# Patient Record
Sex: Female | Born: 1935 | Race: White | Hispanic: No | Marital: Married | Smoking: Former smoker
Health system: Southern US, Community
[De-identification: ages and names within clinical notes are randomized; demographics above are authoritative.]

## PROBLEM LIST (undated history)

## (undated) DIAGNOSIS — J219 Acute bronchiolitis, unspecified: Secondary | ICD-10-CM

## (undated) DIAGNOSIS — F419 Anxiety disorder, unspecified: Secondary | ICD-10-CM

## (undated) DIAGNOSIS — C801 Malignant (primary) neoplasm, unspecified: Secondary | ICD-10-CM

## (undated) DIAGNOSIS — R51 Headache: Secondary | ICD-10-CM

## (undated) DIAGNOSIS — Z1501 Genetic susceptibility to malignant neoplasm of breast: Secondary | ICD-10-CM

## (undated) DIAGNOSIS — J309 Allergic rhinitis, unspecified: Secondary | ICD-10-CM

## (undated) DIAGNOSIS — R0602 Shortness of breath: Secondary | ICD-10-CM

## (undated) DIAGNOSIS — Z1509 Genetic susceptibility to other malignant neoplasm: Secondary | ICD-10-CM

## (undated) DIAGNOSIS — F32A Depression, unspecified: Secondary | ICD-10-CM

## (undated) DIAGNOSIS — J45909 Unspecified asthma, uncomplicated: Secondary | ICD-10-CM

## (undated) DIAGNOSIS — Z803 Family history of malignant neoplasm of breast: Secondary | ICD-10-CM

## (undated) DIAGNOSIS — M1990 Unspecified osteoarthritis, unspecified site: Secondary | ICD-10-CM

## (undated) DIAGNOSIS — Z9889 Other specified postprocedural states: Secondary | ICD-10-CM

## (undated) DIAGNOSIS — Z5189 Encounter for other specified aftercare: Secondary | ICD-10-CM

## (undated) DIAGNOSIS — R011 Cardiac murmur, unspecified: Secondary | ICD-10-CM

## (undated) DIAGNOSIS — C50919 Malignant neoplasm of unspecified site of unspecified female breast: Secondary | ICD-10-CM

## (undated) DIAGNOSIS — R112 Nausea with vomiting, unspecified: Secondary | ICD-10-CM

## (undated) DIAGNOSIS — J189 Pneumonia, unspecified organism: Secondary | ICD-10-CM

## (undated) DIAGNOSIS — H532 Diplopia: Secondary | ICD-10-CM

## (undated) DIAGNOSIS — J449 Chronic obstructive pulmonary disease, unspecified: Secondary | ICD-10-CM

## (undated) DIAGNOSIS — J4489 Other specified chronic obstructive pulmonary disease: Secondary | ICD-10-CM

## (undated) DIAGNOSIS — M25473 Effusion, unspecified ankle: Secondary | ICD-10-CM

## (undated) DIAGNOSIS — I10 Essential (primary) hypertension: Secondary | ICD-10-CM

## (undated) DIAGNOSIS — F329 Major depressive disorder, single episode, unspecified: Secondary | ICD-10-CM

## (undated) HISTORY — DX: Genetic susceptibility to malignant neoplasm of breast: Z15.01

## (undated) HISTORY — PX: COLONOSCOPY: SHX174

## (undated) HISTORY — PX: TONSILLECTOMY: SUR1361

## (undated) HISTORY — DX: Genetic susceptibility to malignant neoplasm of breast: Z15.09

## (undated) HISTORY — DX: Malignant (primary) neoplasm, unspecified: C80.1

## (undated) HISTORY — DX: Allergic rhinitis, unspecified: J30.9

## (undated) HISTORY — PX: CATARACT EXTRACTION: SUR2

## (undated) HISTORY — DX: Cardiac murmur, unspecified: R01.1

## (undated) HISTORY — DX: Chronic obstructive pulmonary disease, unspecified: J44.9

## (undated) HISTORY — DX: Other specified chronic obstructive pulmonary disease: J44.89

## (undated) HISTORY — DX: Malignant neoplasm of unspecified site of unspecified female breast: C50.919

## (undated) HISTORY — DX: Essential (primary) hypertension: I10

## (undated) HISTORY — PX: SKIN CANCER DESTRUCTION: SHX778

## (undated) HISTORY — DX: Family history of malignant neoplasm of breast: Z80.3

## (undated) HISTORY — DX: Encounter for other specified aftercare: Z51.89

## (undated) HISTORY — DX: Unspecified osteoarthritis, unspecified site: M19.90

## (undated) HISTORY — PX: TONSILLECTOMY: SHX5217

---

## 1987-06-12 HISTORY — PX: MASTECTOMY: SHX3

## 1997-11-29 ENCOUNTER — Ambulatory Visit (HOSPITAL_COMMUNITY): Admission: RE | Admit: 1997-11-29 | Discharge: 1997-11-29 | Payer: Self-pay | Admitting: Family Medicine

## 1999-01-25 ENCOUNTER — Encounter: Payer: Self-pay | Admitting: Family Medicine

## 1999-01-25 ENCOUNTER — Ambulatory Visit (HOSPITAL_COMMUNITY): Admission: RE | Admit: 1999-01-25 | Discharge: 1999-01-25 | Payer: Self-pay | Admitting: Family Medicine

## 1999-03-13 ENCOUNTER — Other Ambulatory Visit: Admission: RE | Admit: 1999-03-13 | Discharge: 1999-03-13 | Payer: Self-pay | Admitting: Family Medicine

## 2000-01-02 ENCOUNTER — Encounter: Admission: RE | Admit: 2000-01-02 | Discharge: 2000-01-02 | Payer: Self-pay | Admitting: Family Medicine

## 2000-01-02 ENCOUNTER — Encounter: Payer: Self-pay | Admitting: Family Medicine

## 2000-01-30 ENCOUNTER — Ambulatory Visit (HOSPITAL_COMMUNITY): Admission: RE | Admit: 2000-01-30 | Discharge: 2000-01-30 | Payer: Self-pay | Admitting: Family Medicine

## 2000-01-30 ENCOUNTER — Encounter: Payer: Self-pay | Admitting: Family Medicine

## 2000-06-15 ENCOUNTER — Emergency Department (HOSPITAL_COMMUNITY): Admission: EM | Admit: 2000-06-15 | Discharge: 2000-06-15 | Payer: Self-pay | Admitting: Emergency Medicine

## 2001-01-30 ENCOUNTER — Encounter: Payer: Self-pay | Admitting: Family Medicine

## 2001-01-30 ENCOUNTER — Ambulatory Visit (HOSPITAL_COMMUNITY): Admission: RE | Admit: 2001-01-30 | Discharge: 2001-01-30 | Payer: Self-pay | Admitting: Family Medicine

## 2001-04-30 ENCOUNTER — Other Ambulatory Visit: Admission: RE | Admit: 2001-04-30 | Discharge: 2001-04-30 | Payer: Self-pay | Admitting: Family Medicine

## 2001-08-10 ENCOUNTER — Emergency Department (HOSPITAL_COMMUNITY): Admission: EM | Admit: 2001-08-10 | Discharge: 2001-08-10 | Payer: Self-pay | Admitting: Emergency Medicine

## 2002-04-29 ENCOUNTER — Emergency Department (HOSPITAL_COMMUNITY): Admission: EM | Admit: 2002-04-29 | Discharge: 2002-04-29 | Payer: Self-pay | Admitting: Emergency Medicine

## 2002-04-29 ENCOUNTER — Encounter: Payer: Self-pay | Admitting: Emergency Medicine

## 2003-08-10 ENCOUNTER — Emergency Department (HOSPITAL_COMMUNITY): Admission: AD | Admit: 2003-08-10 | Discharge: 2003-08-10 | Payer: Self-pay | Admitting: Emergency Medicine

## 2003-11-30 ENCOUNTER — Ambulatory Visit (HOSPITAL_COMMUNITY): Admission: RE | Admit: 2003-11-30 | Discharge: 2003-11-30 | Payer: Self-pay | Admitting: Family Medicine

## 2004-06-15 ENCOUNTER — Ambulatory Visit: Payer: Self-pay | Admitting: Internal Medicine

## 2004-06-29 ENCOUNTER — Ambulatory Visit: Payer: Self-pay | Admitting: Internal Medicine

## 2004-06-30 ENCOUNTER — Ambulatory Visit: Payer: Self-pay | Admitting: Internal Medicine

## 2004-07-13 ENCOUNTER — Ambulatory Visit: Payer: Self-pay | Admitting: Internal Medicine

## 2004-07-21 ENCOUNTER — Ambulatory Visit: Payer: Self-pay | Admitting: Internal Medicine

## 2004-08-03 ENCOUNTER — Ambulatory Visit: Payer: Self-pay | Admitting: Internal Medicine

## 2004-08-11 ENCOUNTER — Ambulatory Visit: Payer: Self-pay | Admitting: Internal Medicine

## 2004-08-25 ENCOUNTER — Ambulatory Visit: Payer: Self-pay | Admitting: Internal Medicine

## 2004-09-07 ENCOUNTER — Ambulatory Visit: Payer: Self-pay | Admitting: Internal Medicine

## 2004-09-15 ENCOUNTER — Ambulatory Visit: Payer: Self-pay | Admitting: Internal Medicine

## 2004-09-21 ENCOUNTER — Ambulatory Visit: Payer: Self-pay | Admitting: Internal Medicine

## 2004-10-06 ENCOUNTER — Ambulatory Visit: Payer: Self-pay | Admitting: Internal Medicine

## 2004-10-13 ENCOUNTER — Ambulatory Visit: Payer: Self-pay | Admitting: Internal Medicine

## 2004-10-16 ENCOUNTER — Emergency Department (HOSPITAL_COMMUNITY): Admission: EM | Admit: 2004-10-16 | Discharge: 2004-10-16 | Payer: Self-pay | Admitting: Emergency Medicine

## 2004-10-20 ENCOUNTER — Ambulatory Visit: Payer: Self-pay | Admitting: Internal Medicine

## 2004-10-27 ENCOUNTER — Ambulatory Visit: Payer: Self-pay | Admitting: Internal Medicine

## 2004-11-21 ENCOUNTER — Ambulatory Visit: Payer: Self-pay | Admitting: Internal Medicine

## 2004-11-27 ENCOUNTER — Ambulatory Visit: Payer: Self-pay | Admitting: Internal Medicine

## 2004-12-04 ENCOUNTER — Ambulatory Visit (HOSPITAL_COMMUNITY): Admission: RE | Admit: 2004-12-04 | Discharge: 2004-12-04 | Payer: Self-pay | Admitting: Family Medicine

## 2004-12-07 ENCOUNTER — Ambulatory Visit: Payer: Self-pay | Admitting: Internal Medicine

## 2004-12-15 ENCOUNTER — Ambulatory Visit: Payer: Self-pay | Admitting: Internal Medicine

## 2004-12-29 ENCOUNTER — Ambulatory Visit: Payer: Self-pay | Admitting: Internal Medicine

## 2005-01-12 ENCOUNTER — Ambulatory Visit: Payer: Self-pay | Admitting: Internal Medicine

## 2005-02-02 ENCOUNTER — Ambulatory Visit: Payer: Self-pay | Admitting: Internal Medicine

## 2005-02-16 ENCOUNTER — Ambulatory Visit: Payer: Self-pay | Admitting: Internal Medicine

## 2005-02-19 ENCOUNTER — Ambulatory Visit: Payer: Self-pay | Admitting: Internal Medicine

## 2005-02-23 ENCOUNTER — Ambulatory Visit: Payer: Self-pay | Admitting: Internal Medicine

## 2005-03-09 ENCOUNTER — Ambulatory Visit: Payer: Self-pay | Admitting: Internal Medicine

## 2005-03-30 ENCOUNTER — Ambulatory Visit: Payer: Self-pay | Admitting: Internal Medicine

## 2005-04-04 ENCOUNTER — Ambulatory Visit: Payer: Self-pay | Admitting: Pulmonary Disease

## 2005-04-13 ENCOUNTER — Ambulatory Visit: Payer: Self-pay | Admitting: Internal Medicine

## 2005-04-20 ENCOUNTER — Ambulatory Visit: Payer: Self-pay | Admitting: Internal Medicine

## 2005-05-07 ENCOUNTER — Ambulatory Visit: Payer: Self-pay | Admitting: Internal Medicine

## 2005-06-13 ENCOUNTER — Ambulatory Visit: Payer: Self-pay | Admitting: Internal Medicine

## 2005-06-22 ENCOUNTER — Ambulatory Visit: Payer: Self-pay | Admitting: Internal Medicine

## 2005-07-06 ENCOUNTER — Ambulatory Visit: Payer: Self-pay | Admitting: Internal Medicine

## 2005-07-20 ENCOUNTER — Ambulatory Visit: Payer: Self-pay | Admitting: Internal Medicine

## 2005-08-03 ENCOUNTER — Ambulatory Visit: Payer: Self-pay | Admitting: Internal Medicine

## 2005-08-17 ENCOUNTER — Ambulatory Visit: Payer: Self-pay | Admitting: Internal Medicine

## 2005-09-06 ENCOUNTER — Ambulatory Visit: Payer: Self-pay | Admitting: Internal Medicine

## 2005-09-27 ENCOUNTER — Ambulatory Visit: Payer: Self-pay | Admitting: Internal Medicine

## 2005-10-12 ENCOUNTER — Ambulatory Visit: Payer: Self-pay | Admitting: Internal Medicine

## 2005-10-18 ENCOUNTER — Ambulatory Visit: Payer: Self-pay | Admitting: Internal Medicine

## 2005-10-26 ENCOUNTER — Ambulatory Visit: Payer: Self-pay | Admitting: Internal Medicine

## 2005-11-09 ENCOUNTER — Ambulatory Visit: Payer: Self-pay | Admitting: Internal Medicine

## 2005-11-12 ENCOUNTER — Ambulatory Visit: Payer: Self-pay | Admitting: Internal Medicine

## 2005-11-30 ENCOUNTER — Ambulatory Visit: Payer: Self-pay | Admitting: Internal Medicine

## 2005-12-14 ENCOUNTER — Ambulatory Visit: Payer: Self-pay | Admitting: Internal Medicine

## 2006-01-02 ENCOUNTER — Ambulatory Visit: Payer: Self-pay | Admitting: Internal Medicine

## 2006-01-17 ENCOUNTER — Ambulatory Visit: Payer: Self-pay | Admitting: Internal Medicine

## 2006-02-01 ENCOUNTER — Ambulatory Visit: Payer: Self-pay | Admitting: Internal Medicine

## 2006-02-06 ENCOUNTER — Ambulatory Visit (HOSPITAL_COMMUNITY): Admission: RE | Admit: 2006-02-06 | Discharge: 2006-02-06 | Payer: Self-pay | Admitting: Family Medicine

## 2006-02-15 ENCOUNTER — Ambulatory Visit: Payer: Self-pay | Admitting: Internal Medicine

## 2006-03-01 ENCOUNTER — Ambulatory Visit: Payer: Self-pay | Admitting: Internal Medicine

## 2006-03-04 ENCOUNTER — Ambulatory Visit: Payer: Self-pay | Admitting: Internal Medicine

## 2006-03-15 ENCOUNTER — Ambulatory Visit: Payer: Self-pay | Admitting: Internal Medicine

## 2006-03-25 ENCOUNTER — Ambulatory Visit: Payer: Self-pay | Admitting: Internal Medicine

## 2006-04-02 ENCOUNTER — Ambulatory Visit: Payer: Self-pay | Admitting: Pulmonary Disease

## 2006-04-12 ENCOUNTER — Ambulatory Visit: Payer: Self-pay | Admitting: Internal Medicine

## 2006-04-30 ENCOUNTER — Ambulatory Visit: Payer: Self-pay | Admitting: Internal Medicine

## 2006-05-09 ENCOUNTER — Ambulatory Visit: Payer: Self-pay | Admitting: Internal Medicine

## 2006-05-24 ENCOUNTER — Ambulatory Visit: Payer: Self-pay | Admitting: Internal Medicine

## 2006-05-30 ENCOUNTER — Ambulatory Visit: Payer: Self-pay | Admitting: Internal Medicine

## 2006-06-14 ENCOUNTER — Ambulatory Visit: Payer: Self-pay | Admitting: Internal Medicine

## 2006-06-28 ENCOUNTER — Ambulatory Visit: Payer: Self-pay | Admitting: Internal Medicine

## 2006-07-04 ENCOUNTER — Ambulatory Visit: Payer: Self-pay | Admitting: Internal Medicine

## 2006-07-19 ENCOUNTER — Ambulatory Visit: Payer: Self-pay | Admitting: Internal Medicine

## 2006-07-22 ENCOUNTER — Ambulatory Visit: Payer: Self-pay | Admitting: Internal Medicine

## 2006-08-09 ENCOUNTER — Ambulatory Visit: Payer: Self-pay | Admitting: Internal Medicine

## 2006-08-23 ENCOUNTER — Ambulatory Visit: Payer: Self-pay | Admitting: Internal Medicine

## 2006-09-02 ENCOUNTER — Ambulatory Visit: Payer: Self-pay | Admitting: Internal Medicine

## 2006-09-27 ENCOUNTER — Ambulatory Visit: Payer: Self-pay | Admitting: Internal Medicine

## 2006-10-11 ENCOUNTER — Ambulatory Visit: Payer: Self-pay | Admitting: Internal Medicine

## 2006-11-01 ENCOUNTER — Ambulatory Visit: Payer: Self-pay | Admitting: Internal Medicine

## 2006-11-21 ENCOUNTER — Ambulatory Visit: Payer: Self-pay | Admitting: Internal Medicine

## 2007-01-16 ENCOUNTER — Ambulatory Visit: Payer: Self-pay | Admitting: Internal Medicine

## 2007-01-24 ENCOUNTER — Ambulatory Visit: Payer: Self-pay | Admitting: Internal Medicine

## 2007-02-07 ENCOUNTER — Ambulatory Visit: Payer: Self-pay | Admitting: Internal Medicine

## 2007-02-17 ENCOUNTER — Ambulatory Visit (HOSPITAL_COMMUNITY): Admission: RE | Admit: 2007-02-17 | Discharge: 2007-02-17 | Payer: Self-pay | Admitting: Family Medicine

## 2007-02-24 ENCOUNTER — Ambulatory Visit: Payer: Self-pay | Admitting: Internal Medicine

## 2007-03-03 ENCOUNTER — Other Ambulatory Visit: Admission: RE | Admit: 2007-03-03 | Discharge: 2007-03-03 | Payer: Self-pay | Admitting: Family Medicine

## 2007-03-10 ENCOUNTER — Ambulatory Visit: Payer: Self-pay | Admitting: Internal Medicine

## 2007-03-24 ENCOUNTER — Ambulatory Visit: Payer: Self-pay | Admitting: Pulmonary Disease

## 2007-04-09 ENCOUNTER — Encounter: Admission: RE | Admit: 2007-04-09 | Discharge: 2007-04-09 | Payer: Self-pay | Admitting: Family Medicine

## 2007-05-07 ENCOUNTER — Ambulatory Visit: Payer: Self-pay | Admitting: Internal Medicine

## 2007-05-21 ENCOUNTER — Ambulatory Visit: Payer: Self-pay | Admitting: Internal Medicine

## 2007-05-29 ENCOUNTER — Ambulatory Visit: Payer: Self-pay | Admitting: Internal Medicine

## 2007-06-10 ENCOUNTER — Ambulatory Visit: Payer: Self-pay | Admitting: Internal Medicine

## 2007-06-24 ENCOUNTER — Ambulatory Visit: Payer: Self-pay | Admitting: Internal Medicine

## 2007-07-04 ENCOUNTER — Ambulatory Visit: Payer: Self-pay | Admitting: Internal Medicine

## 2007-07-18 ENCOUNTER — Ambulatory Visit: Payer: Self-pay | Admitting: Internal Medicine

## 2007-07-25 ENCOUNTER — Ambulatory Visit: Payer: Self-pay | Admitting: Internal Medicine

## 2007-08-05 ENCOUNTER — Ambulatory Visit: Payer: Self-pay | Admitting: Internal Medicine

## 2007-08-13 DIAGNOSIS — M810 Age-related osteoporosis without current pathological fracture: Secondary | ICD-10-CM

## 2007-08-13 DIAGNOSIS — J310 Chronic rhinitis: Secondary | ICD-10-CM | POA: Insufficient documentation

## 2007-08-13 DIAGNOSIS — I10 Essential (primary) hypertension: Secondary | ICD-10-CM | POA: Insufficient documentation

## 2007-08-13 DIAGNOSIS — J45998 Other asthma: Secondary | ICD-10-CM | POA: Insufficient documentation

## 2007-08-13 DIAGNOSIS — J4489 Other specified chronic obstructive pulmonary disease: Secondary | ICD-10-CM | POA: Insufficient documentation

## 2007-08-13 DIAGNOSIS — J449 Chronic obstructive pulmonary disease, unspecified: Secondary | ICD-10-CM

## 2007-08-25 ENCOUNTER — Ambulatory Visit: Payer: Self-pay | Admitting: Internal Medicine

## 2007-09-09 ENCOUNTER — Ambulatory Visit: Payer: Self-pay | Admitting: Internal Medicine

## 2007-10-03 ENCOUNTER — Ambulatory Visit: Payer: Self-pay | Admitting: Internal Medicine

## 2007-10-17 ENCOUNTER — Ambulatory Visit: Payer: Self-pay | Admitting: Internal Medicine

## 2007-11-07 ENCOUNTER — Ambulatory Visit: Payer: Self-pay | Admitting: Internal Medicine

## 2007-11-21 ENCOUNTER — Ambulatory Visit: Payer: Self-pay | Admitting: Internal Medicine

## 2007-12-01 ENCOUNTER — Telehealth (INDEPENDENT_AMBULATORY_CARE_PROVIDER_SITE_OTHER): Payer: Self-pay | Admitting: *Deleted

## 2007-12-11 ENCOUNTER — Ambulatory Visit: Payer: Self-pay | Admitting: Internal Medicine

## 2007-12-25 ENCOUNTER — Ambulatory Visit: Payer: Self-pay | Admitting: Internal Medicine

## 2008-01-16 ENCOUNTER — Ambulatory Visit: Payer: Self-pay | Admitting: Internal Medicine

## 2008-02-02 ENCOUNTER — Ambulatory Visit: Payer: Self-pay | Admitting: Internal Medicine

## 2008-02-13 ENCOUNTER — Ambulatory Visit: Payer: Self-pay | Admitting: Internal Medicine

## 2008-02-24 ENCOUNTER — Ambulatory Visit (HOSPITAL_COMMUNITY): Admission: RE | Admit: 2008-02-24 | Discharge: 2008-02-24 | Payer: Self-pay | Admitting: Family Medicine

## 2008-02-27 ENCOUNTER — Ambulatory Visit: Payer: Self-pay | Admitting: Pulmonary Disease

## 2008-02-27 ENCOUNTER — Encounter: Payer: Self-pay | Admitting: Internal Medicine

## 2008-03-12 ENCOUNTER — Ambulatory Visit: Payer: Self-pay | Admitting: Internal Medicine

## 2008-03-26 ENCOUNTER — Ambulatory Visit: Payer: Self-pay | Admitting: Internal Medicine

## 2008-04-02 ENCOUNTER — Ambulatory Visit: Payer: Self-pay | Admitting: Internal Medicine

## 2008-04-13 ENCOUNTER — Encounter: Admission: RE | Admit: 2008-04-13 | Discharge: 2008-04-13 | Payer: Self-pay | Admitting: Family Medicine

## 2008-05-04 ENCOUNTER — Ambulatory Visit: Payer: Self-pay | Admitting: Internal Medicine

## 2008-06-10 ENCOUNTER — Ambulatory Visit: Payer: Self-pay | Admitting: Internal Medicine

## 2008-06-10 ENCOUNTER — Ambulatory Visit: Payer: Self-pay | Admitting: Pulmonary Disease

## 2008-06-25 ENCOUNTER — Ambulatory Visit: Payer: Self-pay | Admitting: Internal Medicine

## 2008-07-30 ENCOUNTER — Ambulatory Visit: Payer: Self-pay | Admitting: Internal Medicine

## 2008-08-12 ENCOUNTER — Ambulatory Visit: Payer: Self-pay | Admitting: Internal Medicine

## 2008-09-17 ENCOUNTER — Ambulatory Visit: Payer: Self-pay | Admitting: Internal Medicine

## 2008-09-24 ENCOUNTER — Ambulatory Visit: Payer: Self-pay | Admitting: Internal Medicine

## 2008-10-01 ENCOUNTER — Ambulatory Visit: Payer: Self-pay | Admitting: Internal Medicine

## 2008-10-08 ENCOUNTER — Ambulatory Visit: Payer: Self-pay | Admitting: Internal Medicine

## 2008-10-15 ENCOUNTER — Ambulatory Visit: Payer: Self-pay | Admitting: Internal Medicine

## 2008-10-28 ENCOUNTER — Ambulatory Visit: Payer: Self-pay | Admitting: Internal Medicine

## 2008-11-18 ENCOUNTER — Ambulatory Visit: Payer: Self-pay | Admitting: Internal Medicine

## 2008-12-10 ENCOUNTER — Ambulatory Visit: Payer: Self-pay | Admitting: Internal Medicine

## 2008-12-17 ENCOUNTER — Ambulatory Visit: Payer: Self-pay | Admitting: Internal Medicine

## 2008-12-31 ENCOUNTER — Ambulatory Visit: Payer: Self-pay | Admitting: Internal Medicine

## 2009-01-14 ENCOUNTER — Ambulatory Visit: Payer: Self-pay | Admitting: Internal Medicine

## 2009-01-28 ENCOUNTER — Ambulatory Visit: Payer: Self-pay | Admitting: Internal Medicine

## 2009-02-04 ENCOUNTER — Ambulatory Visit: Payer: Self-pay | Admitting: Internal Medicine

## 2009-02-11 ENCOUNTER — Ambulatory Visit: Payer: Self-pay | Admitting: Internal Medicine

## 2009-02-25 ENCOUNTER — Ambulatory Visit: Payer: Self-pay | Admitting: Internal Medicine

## 2009-03-04 ENCOUNTER — Ambulatory Visit: Payer: Self-pay | Admitting: Internal Medicine

## 2009-03-11 ENCOUNTER — Ambulatory Visit: Payer: Self-pay | Admitting: Internal Medicine

## 2009-03-24 ENCOUNTER — Ambulatory Visit: Payer: Self-pay | Admitting: Internal Medicine

## 2009-04-01 ENCOUNTER — Ambulatory Visit: Payer: Self-pay | Admitting: Internal Medicine

## 2009-04-19 ENCOUNTER — Ambulatory Visit: Payer: Self-pay | Admitting: Internal Medicine

## 2009-04-29 ENCOUNTER — Ambulatory Visit: Payer: Self-pay | Admitting: Internal Medicine

## 2009-05-13 ENCOUNTER — Ambulatory Visit: Payer: Self-pay | Admitting: Internal Medicine

## 2009-05-20 ENCOUNTER — Ambulatory Visit: Payer: Self-pay | Admitting: Internal Medicine

## 2009-06-17 ENCOUNTER — Ambulatory Visit: Payer: Self-pay | Admitting: Internal Medicine

## 2009-07-01 ENCOUNTER — Ambulatory Visit: Payer: Self-pay | Admitting: Internal Medicine

## 2009-07-08 ENCOUNTER — Ambulatory Visit: Payer: Self-pay | Admitting: Internal Medicine

## 2009-07-22 ENCOUNTER — Ambulatory Visit: Payer: Self-pay | Admitting: Internal Medicine

## 2009-08-19 ENCOUNTER — Ambulatory Visit: Payer: Self-pay | Admitting: Internal Medicine

## 2009-09-02 ENCOUNTER — Ambulatory Visit: Payer: Self-pay | Admitting: Internal Medicine

## 2009-09-16 ENCOUNTER — Ambulatory Visit: Payer: Self-pay | Admitting: Internal Medicine

## 2009-10-07 ENCOUNTER — Ambulatory Visit: Payer: Self-pay | Admitting: Internal Medicine

## 2009-10-14 ENCOUNTER — Ambulatory Visit: Payer: Self-pay | Admitting: Internal Medicine

## 2009-10-24 ENCOUNTER — Ambulatory Visit: Payer: Self-pay | Admitting: Internal Medicine

## 2009-11-22 ENCOUNTER — Ambulatory Visit: Payer: Self-pay | Admitting: Internal Medicine

## 2009-11-29 ENCOUNTER — Ambulatory Visit: Payer: Self-pay | Admitting: Internal Medicine

## 2009-12-16 ENCOUNTER — Ambulatory Visit: Payer: Self-pay | Admitting: Internal Medicine

## 2009-12-23 ENCOUNTER — Ambulatory Visit: Payer: Self-pay | Admitting: Internal Medicine

## 2009-12-26 ENCOUNTER — Ambulatory Visit: Payer: Self-pay | Admitting: Internal Medicine

## 2010-01-10 ENCOUNTER — Ambulatory Visit: Payer: Self-pay | Admitting: Internal Medicine

## 2010-01-26 ENCOUNTER — Encounter: Payer: Self-pay | Admitting: Internal Medicine

## 2010-01-27 ENCOUNTER — Ambulatory Visit: Payer: Self-pay | Admitting: Internal Medicine

## 2010-02-03 ENCOUNTER — Ambulatory Visit: Payer: Self-pay | Admitting: Internal Medicine

## 2010-02-24 ENCOUNTER — Ambulatory Visit: Payer: Self-pay | Admitting: Internal Medicine

## 2010-03-07 ENCOUNTER — Ambulatory Visit (HOSPITAL_COMMUNITY): Admission: RE | Admit: 2010-03-07 | Discharge: 2010-03-07 | Payer: Self-pay | Admitting: Family Medicine

## 2010-03-08 ENCOUNTER — Ambulatory Visit: Payer: Self-pay | Admitting: Internal Medicine

## 2010-03-17 ENCOUNTER — Ambulatory Visit: Payer: Self-pay | Admitting: Internal Medicine

## 2010-03-24 ENCOUNTER — Ambulatory Visit: Payer: Self-pay | Admitting: Internal Medicine

## 2010-04-07 ENCOUNTER — Ambulatory Visit: Payer: Self-pay | Admitting: Internal Medicine

## 2010-05-12 ENCOUNTER — Ambulatory Visit: Payer: Self-pay | Admitting: Internal Medicine

## 2010-05-22 ENCOUNTER — Ambulatory Visit: Payer: Self-pay | Admitting: Internal Medicine

## 2010-06-13 ENCOUNTER — Ambulatory Visit: Payer: Self-pay | Admitting: Internal Medicine

## 2010-07-02 ENCOUNTER — Encounter: Payer: Self-pay | Admitting: Family Medicine

## 2010-07-06 ENCOUNTER — Ambulatory Visit: Payer: Self-pay | Admitting: Internal Medicine

## 2010-07-11 NOTE — Miscellaneous (Signed)
Summary: Orders Update pft charges  Clinical Lists Changes  Orders: Added new Service order of Carbon Monoxide diffusing w/capacity (94720) - Signed Added new Service order of Lung Volumes (94240) - Signed Added new Service order of Spirometry (Pre & Post) (94060) - Signed 

## 2010-07-11 NOTE — Miscellaneous (Signed)
Summary: Injection Record/[facility]  Injection Record/[facility]   Imported By: Sherian Rein 10/13/2009 13:56:00  _____________________________________________________________________  External Attachment:    Type:   Image     Comment:   External Document

## 2010-07-11 NOTE — Miscellaneous (Signed)
Summary: Injection Record / [facility]    Injection Record / [facility]    Imported By: Lennie Odor 02/10/2010 09:57:20  _____________________________________________________________________  External Attachment:    Type:   Image     Comment:   External Document

## 2010-07-11 NOTE — Miscellaneous (Signed)
Summary: Change RX for Fexofenadine RX  Clinical Lists Changes  Medications: Changed medication from ALLEGRA 60 MG  TABS (FEXOFENADINE HCL) take 2 tabs by mouth once daily to FEXOFENADINE HCL 30 MG TABS (FEXOFENADINE HCL) take 1 by mouth two times a day - Signed Rx of FEXOFENADINE HCL 30 MG TABS (FEXOFENADINE HCL) take 1 by mouth two times a day;  #60 x 11;  Signed;  Entered by: Reynaldo Minium CMA;  Authorized by: Waymon Budge MD;  Method used: Electronically to [facility] [address], [place], [place]  [id number], Ph: [phone] or [phone], Fax: [phone]    Prescriptions: FEXOFENADINE HCL 30 MG TABS (FEXOFENADINE HCL) take 1 by mouth two times a day  #60 x 11   Entered by:   Reynaldo Minium CMA   Authorized by:   Waymon Budge MD   Signed by:   Reynaldo Minium CMA on 01/26/2010   Method used:   Electronically to        [facility] (retail)       [address]       [place], [place]  [id number]       Ph: [phone] or [phone]       Fax: [phone]   RxID:   [id number]

## 2010-07-11 NOTE — Assessment & Plan Note (Signed)
Summary: 12 months/apc   Primary Provider/Referring Provider:  Arvilla Market  CC:  Yearly COPD follow up.  Pt states breathing is doing well overall.  Pt states she does cough "some" - occ prod with clear mucus.  C/o sneezing.  Denies wheezing and chest tightness.  Marland Kitchen  History of Present Illness: History of Present Illness: 3/16/109- Asthma/ COPD, rhinitis Discussed sick with viral bronchitis syndrome last November. Then had a cold in January and needed cough syrup. Likes tessalon perles and asks refill. Still feeling a little more short oof breath than usual. asks a handicapped tag for car for occasional winter use- discussed.  11-06-2008- Asthma/  COPD, rhinitis Had bronchitis in November. Much sneezing this Spring- pollen. Continues allergy vaccine. We reviewed meds.  Had Pneumovax in 1995 and 2000. Discussed Advair side effects on TV ADs, esp osteoporosis and pneunmonia risks.  Oct 24, 2009- Asthma/ COPD, rhinitis Blowing and sneezing more,  but considers the late Fall her worst season. Blowing her nose irritates the outside skin of her nose. Mild recurrent epistaxis. Dependent edema. Denies chest tightness, occasional cough, limiting dyspnea, chest pain or palpitation. Meds reviewed. Up to date with flu and pneumovax.   Current Medications (verified): 1)  Advair Diskus 100-50 Mcg/dose Misc (Fluticasone-Salmeterol) .... Inhale 1 Puff As Directed Once Daily 2)  Allergy Shots 1:10 Gh .... As Directed 3)  Nasonex 50 Mcg/act  Susp (Mometasone Furoate) .... Two Puffs Each Nostril Daily 4)  Norvasc 10 Mg  Tabs (Amlodipine Besylate) .... Take 1 Tablet By Mouth Once A Day 5)  Allegra 60 Mg  Tabs (Fexofenadine Hcl) .... Take 2 Tabs By Mouth Once Daily 6)  Simvastatin 10 Mg Tabs (Simvastatin) .... Take One Tablet By Mouth At Bedtime 7)  Astelin 137 Mcg/spray  Soln (Azelastine Hcl) .... Use As Needed 8)  Tylenol Extra Strength 500 Mg  Tabs (Acetaminophen) .... Take By Mouth As Needed 9)  Proventil  Hfa 108 (90 Base) Mcg/act Aers (Albuterol Sulfate) .... Inhale 2 Puffs Every Four Hours As Needed - Needs Ov With Dr. Maple Hudson! 10)  Alprazolam 0.25 Mg  Tabs (Alprazolam) .... Take 1/2 Tab  By Mouth As Needed 11)  Vitamin D 1000 Unit Tabs (Cholecalciferol) .... Take 2 Tablets By Mouth Once A Day 12)  Centrum Silver  Tabs (Multiple Vitamins-Minerals) .... Take 1 Tablet By Mouth Once A Day 13)  Glucosamine Sulfate .... As Needed  Allergies (verified): 1)  ! Codeine 2)  ! Septra  Past History:  Past Medical History: Last updated: 11/06/2008 asthma/copd rhinitis Hypertension  Hx Pneumonia- hosp  Osteoporosis  Past Surgical History: Last updated: 06-Nov-2008 mastectomy- left cataracts  Family History: Last updated: 2008/11/06 Father- died pancreatic cancer Mother- died breast cancer  Social History: Last updated: 11/06/2008 Patient states former smoker.  Married  Risk Factors: Smoking Status: quit (08/25/2007)  Review of Systems      See HPI       The patient complains of shortness of breath with activity and non-productive cough.  The patient denies shortness of breath at rest, productive cough, chest pain, irregular heartbeats, acid heartburn, indigestion, loss of appetite, weight change, abdominal pain, difficulty swallowing, sore throat, tooth/dental problems, headaches, and sneezing.    Vital Signs:  Patient profile:   77 year old female Height:      64 inches Weight:      100 pounds BMI:     17.23 O2 Sat:      96 % on Room air Pulse rate:   91 /  minute BP sitting:   114 / 70  (left arm) Cuff size:   regular  Vitals Entered By: Gweneth Dimitri RN (Oct 24, 2009 11:02 AM)  O2 Flow:  Room air CC: Yearly COPD follow up.  Pt states breathing is doing well overall.  Pt states she does cough "some" - occ prod with clear mucus.  C/o sneezing.  Denies wheezing and chest tightness.   Comments Medications reviewed with patient Daytime contact number verified with  patient. Gweneth Dimitri RN  Oct 24, 2009 11:01 AM    Physical Exam  Additional Exam:  General: A/Ox3; pleasant and cooperative, NAD, slender, very alert SKIN: no rash, lesions NODES: no lymphadenopathy HEENT: [place]/AT, EOM- WNL, Conjuctivae- clear, periorbital edema, PERRLA, TM-WNL, Nose- superficial blood right septum anteriorly., Throat- clear and wnl, speech clear, own teeth, Mallampati II NECK: Supple w/ fair ROM, JVD- none, normal carotid impulses w/o bruits Thyroid CHEST: minor rattle right lateral chest cleared with cough HEART: RRR, no m/g/r heard ABDOMEN:  ZOX:WRUE, nl pulses, no edema now NEURO: Grossly intact to observation      Impression & Recommendations:  Problem # 1:  RHINITIS (ICD-472.0)  Sneezing and blowing now. We discussed technique with nasal steroid spray, use of protective saline gel, and oeverdrying with antihistamines. She will continue allergy vaccine.  Problem # 2:  C O P D (ICD-496) We will update CXR and get PFT.  Medications Added to Medication List This Visit: 1)  Vitamin D 1000 Unit Tabs (Cholecalciferol) .... Take 2 tablets by mouth once a day 2)  Glucosamine Sulfate  .... As needed  Other Orders: Est. Patient Level III (45409) T-2 View CXR (71020TC)  Patient Instructions: 1)  Please schedule a follow-up appointment in 1 month. 2)  Schedule PFT 3)  A chest x-ray has been recommended.  Your imaging study may require preauthorization.  4)  Try pointing your nose spray "toward the eye" on each side, to avoid spraying it directly into the lining of your nose. 5)  Try using otc "nasal saline gel" to soothe and protect the irritated area in your nose.   Immunization History:  Influenza Immunization History:    Influenza:  historical (03/11/2009)

## 2010-07-11 NOTE — Assessment & Plan Note (Signed)
Summary: 1 MONTH Rhonda W/ PFT ///kp   Primary Provider/Referring Provider:  Arvilla Market  CC:  Rhonda Bryant had pfts.  History of Present Illness: 12-Nov-2008- Asthma/  COPD, rhinitis Had bronchitis in November. Much sneezing this Spring- pollen. Continues allergy vaccine. We reviewed meds.  Had Pneumovax in 1995 and 2000. Discussed Advair side effects on TV ADs, esp osteoporosis and pneunmonia risks.  Oct 24, 2009- Asthma/ COPD, rhinitis Blowing and sneezing more,  but considers the late Fall her worst season. Blowing her nose irritates the outside skin of her nose. Mild recurrent epistaxis. Dependent edema. Denies chest tightness, occasional cough, limiting dyspnea, chest pain or palpitation. Meds reviewed. Up to date with flu and pneumovax.  November 29, 2009- Asthma/ COPD, rhinitis Still blowing nose some, but was worse in the winter. Nasonex helps. Chest feels fine without cough or wheeze. Uses Advair daily and only rare need for rescue inhaler., Denies chest pains, palpitations. PFT reviewed: mod obstruction with response to dilator, normal DLCO. FEV1 1.15/ 68%; FEV1/FVC 0.57 She exercises at home. CXR 10/24/09- COPD, NAD.  Asthma History    Initial Asthma Severity Rating:    Age range: 12+ years    Symptoms: 0-2 days/week    Nighttime Awakenings: 0-2/month    Interferes w/ normal activity: no limitations    SABA use (not for EIB): 0-2 days/week    Asthma Severity Assessment: Intermittent   Preventive Screening-Counseling & Management  Alcohol-Tobacco     Smoking Status: quit > 6 months havent smoked since 77 years old     Tobacco Counseling: to remain off tobacco products  Current Medications (verified): 1)  Advair Diskus 100-50 Mcg/dose Misc (Fluticasone-Salmeterol) .... Inhale 1 Puff As Directed Once Daily 2)  Allergy Shots 1:10 Gh .... As Directed 3)  Nasonex 50 Mcg/act  Susp (Mometasone Furoate) .... Two Puffs Each Nostril Daily 4)  Norvasc 10 Mg  Tabs (Amlodipine Besylate) ....  Take 1 Tablet By Mouth Once A Day 5)  Allegra 60 Mg  Tabs (Fexofenadine Hcl) .... Take 2 Tabs By Mouth Once Daily 6)  Simvastatin 10 Mg Tabs (Simvastatin) .... Take One Tablet By Mouth At Bedtime 7)  Astelin 137 Mcg/spray  Soln (Azelastine Hcl) .... Use As Needed 8)  Tylenol Extra Strength 500 Mg  Tabs (Acetaminophen) .... Take By Mouth As Needed 9)  Proventil Hfa 108 (90 Base) Mcg/act Aers (Albuterol Sulfate) .... Inhale 2 Puffs Every Four Hours As Needed - Needs Ov With Dr. Maple Hudson! 10)  Alprazolam 0.25 Mg  Tabs (Alprazolam) .... Take 1/2 Tab  By Mouth As Needed 11)  Vitamin D 1000 Unit Tabs (Cholecalciferol) .... Take 2 Tablets By Mouth Once A Day 12)  Centrum Silver  Tabs (Multiple Vitamins-Minerals) .... Take 1 Tablet By Mouth Once A Day 13)  Glucosamine Sulfate .... As Needed  Allergies: 1)  ! Codeine 2)  ! Septra  Past History:  Past Medical History: Last updated: 12-Nov-2008 asthma/copd rhinitis Hypertension  Hx Pneumonia- hosp  Osteoporosis  Past Surgical History: Last updated: 11/12/2008 mastectomy- left cataracts  Family History: Last updated: November 12, 2008 Father- died pancreatic cancer Mother- died breast cancer  Social History: Last updated: 2008-11-12 Patient states former smoker.  Married  Risk Factors: Smoking Status: quit > 6 months havent smoked since 77 years old (11/29/2009)  Social History: Smoking Status:  quit > 6 months havent smoked since 77 years old  Review of Systems      See HPI       The patient complains of shortness  of breath with activity and nasal congestion/difficulty breathing through nose.  The patient denies shortness of breath at rest, productive cough, non-productive cough, coughing up blood, chest pain, irregular heartbeats, acid heartburn, indigestion, loss of appetite, weight change, abdominal pain, difficulty swallowing, sore throat, tooth/dental problems, headaches, and sneezing.    Vital Signs:  Patient profile:   77 year  old female Height:      64 inches Weight:      99 pounds BMI:     17.05 O2 Sat:      98 % on Room air Pulse rate:   86 / minute BP sitting:   112 / 62  (right arm) Cuff size:   regular  Vitals Entered By: Kandice Hams (November 29, 2009 2:20 PM)  O2 Flow:  Room air CC: Rhonda Bryant had pfts   Physical Exam  Additional Exam:  General: A/Ox3; pleasant and cooperative, NAD, slender, very alert SKIN: no rash, lesions NODES: no lymphadenopathy HEENT: [place]/AT, EOM- WNL, Conjuctivae- clear, periorbital edema, PERRLA, TM-WNL, Nose- superficial blood right septum anteriorly., Throat- clear and wnl, speech clear, own teeth, Mallampati II NECK: Supple w/ fair ROM, JVD- none, normal carotid impulses w/o bruits Thyroid CHEST: minor rattle right lateral chest cleared with cough HEART: RRR, no m/g/r heard ABDOMEN: slender ZOX:WRUE, nl pulses, no edema now NEURO: Grossly intact to observation      Impression & Recommendations:  Problem # 1:  C O P D (ICD-496) Moderate disease by PFT and CXR. Clinically there is a mixture of emphysema and chronic asthmatic bronchitis with some response to dilator.  there has been good control of the bronchospastic component. She will continue to exercise and we reminded of flu vax in the Fall.  Problem # 2:  RHINITIS (ICD-472.0)  She continues allergy vaccine. Nasal congestion is better after a seasonal flare. She is encouraged to use Nasonex every day. She has not had epistaxis.  Other Orders: Est. Patient Level IV (45409)  Patient Instructions: 1)  Please schedule a follow-up appointment in 6 months. 2)  Try using the nasal spray more consistently, once daily at bedtime.  3)  Continue Advair, allergy vaccine and your other meds as before.

## 2010-07-13 NOTE — Assessment & Plan Note (Signed)
Summary: 6 MONTH RETURN/[facility]   Primary Provider/Referring Provider:  Cam Hai, MD  CC:  6 month follow up visit-allergies; would like to have something in RX for allergies instead of OTC fexofenadine(too expensive)..  History of Present Illness: Oct 24, 2009- Asthma/ COPD, rhinitis Blowing and sneezing more,  but considers the late Fall her worst season. Blowing her nose irritates the outside skin of her nose. Mild recurrent epistaxis. Dependent edema. Denies chest tightness, occasional cough, limiting dyspnea, chest pain or palpitation. Meds reviewed. Up to date with flu and pneumovax.  November 29, 2009- Asthma/ COPD, rhinitis Still blowing nose some, but was worse in the winter. Nasonex helps. Chest feels fine without cough or wheeze. Uses Advair daily and only rare need for rescue inhaler., Denies chest pains, palpitations. PFT reviewed: mod obstruction with response to dilator, normal DLCO. FEV1 1.15/ 68%; FEV1/FVC 0.57 She exercises at home. CXR 10/24/09- COPD, NAD.  May 22, 2010- Asthma/ COPD, rhinitis Nurse-CC: 6 month follow up visit-allergies; would like to have something in RX for allergies instead of OTC fexofenadine(too expensive). Got flu shot. Continues to do well with allergy vaccine and says she is doing fine. Notes still a little cough with minimal phlegm. Denies shortness of breath with brisk walk, chest pain, palpitation. Cold air may tighten her a little.  She asks substitute for fexofenadine. Uses Advair once daily and isn't needing Proventil.   Asthma History    Asthma Control Assessment:    Age range: 12+ years    Symptoms: 0-2 days/week    Nighttime Awakenings: 0-2/month    Interferes w/ normal activity: no limitations    SABA use (not for EIB): 0-2 days/week    Asthma Control Assessment: Well Controlled   Preventive Screening-Counseling & Management  Alcohol-Tobacco     Smoking Status: quit > 6 months havent smoked since 77 years old     Year Quit:  age 6      Tobacco Counseling: to remain off tobacco products  Current Medications (verified): 1)  Advair Diskus 100-50 Mcg/dose Misc (Fluticasone-Salmeterol) .... Inhale 1 Puff As Directed Once Daily 2)  Allergy Shots 1:10 Gh .... As Directed 3)  Nasonex 50 Mcg/act  Susp (Mometasone Furoate) .... Two Puffs Each Nostril Daily 4)  Norvasc 10 Mg  Tabs (Amlodipine Besylate) .... Take 1 Tablet By Mouth Once A Day 5)  Fexofenadine Hcl 30 Mg Tabs (Fexofenadine Hcl) .... Take 1 By Mouth Two Times A Day 6)  Simvastatin 10 Mg Tabs (Simvastatin) .... Take One Tablet By Mouth At Bedtime 7)  Astelin 137 Mcg/spray  Soln (Azelastine Hcl) .... Use As Needed 8)  Tylenol Extra Strength 500 Mg  Tabs (Acetaminophen) .... Take By Mouth As Needed 9)  Proventil Hfa 108 (90 Base) Mcg/act Aers (Albuterol Sulfate) .... Inhale 2 Puffs Every Four Hours As Needed - Needs Ov With Dr. Maple Hudson! 10)  Alprazolam 0.25 Mg  Tabs (Alprazolam) .... Take 1/2-1 Tab  By Mouth As Needed 11)  Vitamin D 1000 Unit Tabs (Cholecalciferol) .... Take 1 Tablets By Mouth Once A Day 12)  Centrum Silver  Tabs (Multiple Vitamins-Minerals) .... Take 1 Tablet By Mouth Once A Day 13)  Glucosamine Sulfate .... As Needed  Allergies (verified): 1)  ! Codeine 2)  ! Septra  Past History:  Past Medical History: Last updated: 11/20/08 asthma/copd rhinitis Hypertension  Hx Pneumonia- hosp  Osteoporosis  Past Surgical History: Last updated: November 20, 2008 mastectomy- left cataracts  Family History: Last updated: November 20, 2008 Father- died pancreatic cancer Mother- died  breast cancer  Social History: Last updated: 10/28/2008 Patient states former smoker.  Married  Risk Factors: Smoking Status: quit > 6 months havent smoked since 77 years old (05/22/2010)  Review of Systems      See HPI       The patient complains of productive cough and nasal congestion/difficulty breathing through nose.  The patient denies shortness of breath with  activity, shortness of breath at rest, coughing up blood, chest pain, irregular heartbeats, acid heartburn, indigestion, loss of appetite, weight change, abdominal pain, difficulty swallowing, sore throat, tooth/dental problems, headaches, sneezing, rash, change in color of mucus, and fever.    Vital Signs:  Patient profile:   77 year old female Height:      64 inches Weight:      97.13 pounds BMI:     16.73 O2 Sat:      100 % on Room air Pulse rate:   78 / minute BP sitting:   118 / 60  (right arm) Cuff size:   regular  Vitals Entered By: Reynaldo Minium CMA (May 22, 2010 2:37 PM)  O2 Flow:  Room air CC: 6 month follow up visit-allergies; would like to have something in RX for allergies instead of OTC fexofenadine(too expensive).   Physical Exam  Additional Exam:  General: A/Ox3; pleasant and cooperative, NAD, slender, very alert SKIN: no rash, lesions NODES: no lymphadenopathy HEENT: [place]/AT, EOM- WNL, Conjuctivae- clear, periorbital edema, PERRLA, TM-WNL, Nose- superficial blood right septum anteriorly., Throat- clear and wnl, speech clear, own teeth, Mallampati II NECK: Supple w/ fair ROM, JVD- none, normal carotid impulses w/o bruits Thyroid CHEST: minor rattle right lateral chest cleared with cough HEART: RRR, no m/g/r heard ABDOMEN: slender ZOX:WRUE, nl pulses, no edema now NEURO: Grossly intact to observation      Impression & Recommendations:  Problem # 1:  C O P D (ICD-496) She is really doing quite well and able to do her housework. We don't need to change these meds.   Problem # 2:  RHINITIS (ICD-472.0)  We will have her try Clarinex since fexofenadine isn't meeting her needs. I discussed environmental precautions and indoor heat issues.   Medications Added to Medication List This Visit: 1)  Clarinex 5 Mg Tabs (Desloratadine) .Marland Kitchen.. 1 daily as needed antihistamine 2)  Alprazolam 0.25 Mg Tabs (Alprazolam) .... Take 1/2-1 tab  by mouth as needed 3)  Vitamin D  1000 Unit Tabs (Cholecalciferol) .... Take 1 tablets by mouth once a day  Other Orders: Est. Patient Level III (45409)  Patient Instructions: 1)  Please schedule a follow-up appointment in 6 months. 2)  Script for Clarinex was sent to [facility]. You can ask them to help you compare price with otc fexofenadine and choose not to take the Clarinex script if you don't want it.  Prescriptions: CLARINEX 5 MG TABS (DESLORATADINE) 1 daily as needed antihistamine  #30 x prn   Entered and Authorized by:   Waymon Budge MD   Signed by:   Waymon Budge MD on 05/22/2010   Method used:   Electronically to        [facility] (retail)       [address] [place] [address]       [place], [place]  [address]       Ph: [phone] or [phone]       Fax: [phone]   RxID:   [id number]

## 2010-07-21 ENCOUNTER — Encounter: Payer: Self-pay | Admitting: Internal Medicine

## 2010-07-21 DIAGNOSIS — J301 Allergic rhinitis due to pollen: Secondary | ICD-10-CM

## 2010-08-11 ENCOUNTER — Encounter: Payer: Self-pay | Admitting: Internal Medicine

## 2010-08-11 ENCOUNTER — Ambulatory Visit (INDEPENDENT_AMBULATORY_CARE_PROVIDER_SITE_OTHER): Payer: Medicare Other

## 2010-08-11 DIAGNOSIS — J301 Allergic rhinitis due to pollen: Secondary | ICD-10-CM | POA: Insufficient documentation

## 2010-08-17 NOTE — Assessment & Plan Note (Signed)
Summary: ALLERGY/CB  Nurse Visit   Allergies: 1)  ! Codeine 2)  ! Septra  Orders Added: 1)  Allergy Injection (1) [16109]

## 2010-08-25 ENCOUNTER — Encounter: Payer: Self-pay | Admitting: Internal Medicine

## 2010-08-25 ENCOUNTER — Ambulatory Visit (INDEPENDENT_AMBULATORY_CARE_PROVIDER_SITE_OTHER): Payer: Medicare Other

## 2010-08-25 DIAGNOSIS — J301 Allergic rhinitis due to pollen: Secondary | ICD-10-CM

## 2010-08-29 NOTE — Assessment & Plan Note (Signed)
Summary: allergy/cb  Nurse Visit   Allergies: 1)  ! Codeine 2)  ! Septra  Orders Added: 1)  Allergy Injection (1) [40102]

## 2010-08-29 NOTE — Miscellaneous (Signed)
Summary: Injection [employer] risk [employer]   Imported By: Sherian Rein 08/21/2010 14:26:40  _____________________________________________________________________  External Attachment:    Type:   Image     Comment:   External Document

## 2010-09-08 ENCOUNTER — Ambulatory Visit (INDEPENDENT_AMBULATORY_CARE_PROVIDER_SITE_OTHER): Payer: Medicare Other

## 2010-09-08 DIAGNOSIS — J301 Allergic rhinitis due to pollen: Secondary | ICD-10-CM

## 2010-10-06 ENCOUNTER — Ambulatory Visit (INDEPENDENT_AMBULATORY_CARE_PROVIDER_SITE_OTHER): Payer: Medicare Other

## 2010-10-06 DIAGNOSIS — J309 Allergic rhinitis, unspecified: Secondary | ICD-10-CM

## 2010-10-13 ENCOUNTER — Ambulatory Visit (INDEPENDENT_AMBULATORY_CARE_PROVIDER_SITE_OTHER): Payer: Medicare Other

## 2010-10-13 DIAGNOSIS — J309 Allergic rhinitis, unspecified: Secondary | ICD-10-CM

## 2010-10-16 ENCOUNTER — Ambulatory Visit (INDEPENDENT_AMBULATORY_CARE_PROVIDER_SITE_OTHER): Payer: Medicare Other

## 2010-10-16 DIAGNOSIS — J309 Allergic rhinitis, unspecified: Secondary | ICD-10-CM

## 2010-10-24 NOTE — Assessment & Plan Note (Signed)
[facility]                             PULMONARY OFFICE NOTE   NAME:Bryant, Rhonda ROGACKI                    MRN:          [id number]  DATE:02/24/2007                            DOB:          1924/09/20    PULMONARY FOLLOWUP   PROBLEM LIST:  1. Asthma/chronic obstructive pulmonary disease.  2. Rhinitis.  3. Mastectomy.  4. Osteoporosis.  5. Hypertension.   HISTORY:  Seasonal sneezing has not been a big problem this fall.  Exertional dyspnea is stable.  She paces herself.  No sudden events.  No  particular concerns.   MEDICATIONS:  1. She remains comfortable with vaccine at 1:10.  2. Nasonex.  3. Calcitriol 0.25 mcg.  4. Norvasc 10 mg.  5. Fosamax 70 mg.  6. Allegra 60 mg x2.  7. Accolate 20 mg b.i.d.  8. Advair 100/50 usually used once daily.  9. Simvastatin.  10.Vitamin D.  11.P.r.n. use of Astelin.  12.Albuterol.  13.Alprazolam.   DRUG INTOLERANCES:  CODEINE, SEPTRA.   OBJECTIVE:  Weight 106 pounds, BP 128/60, pulse 81, room air saturation  97%.  Trim, alert, and appears comfortable.  Conjunctivae are not injected.  No adenopathy or rashes evident.  Nasopharynx clear.  CHEST:  Clear with no cough or wheeze.  Breathing is unlabored.  HEART:  Sounds are regular without murmur or gallop.   Chest x-ray was done today and report returns describing COPD with  biapical pleural parenchymal scarring.  Otherwise, clear.  No change  indicated since Oct 16, 2004.   IMPRESSION:  1. Stable chronic obstructive pulmonary disease.  2. Seasonal allergic rhinitis.  She is adequately controlled.   PLAN:  Chest x-ray was done.  Schedule return in 6 months, earlier  p.r.n.     Clinton D. Maple Hudson, MD, Tonny Bollman, FACP  Electronically Signed    CDY/MedQ  DD: 02/26/2007  DT: 02/26/2007  Job #: [id number]   cc:   Donia Guiles, M.D.

## 2010-10-27 ENCOUNTER — Ambulatory Visit (INDEPENDENT_AMBULATORY_CARE_PROVIDER_SITE_OTHER): Payer: Medicare Other

## 2010-10-27 DIAGNOSIS — J309 Allergic rhinitis, unspecified: Secondary | ICD-10-CM

## 2010-10-27 NOTE — Assessment & Plan Note (Signed)
[facility]                               PULMONARY OFFICE NOTE   NAME:Rhonda Bryant, Rhonda Bryant                    MRN:          [id number]  DATE:03/04/2006                            DOB:          March 09, 1925   PROBLEM LIST:  1. Asthma/chronic obstructive pulmonary disease.  2. Rhinitis.  3. Left mastectomy.  4. Osteoporosis.  5. Hypertension.   HISTORY:  One year followup.  She has been sneezing more in the last two  weeks with some postnasal drainage but says her chest feels fine with no  cough, wheeze, or shortness of breath.  She is using Advair but not needing  her albuterol.  She continues vaccine here at 1:10.   MEDICATIONS:  Nasonex, calcitriol, Norvasc 10 mg, Fosamax 70 mg, Allegra 60  mg b.i.d., Accolate 20 mg x2, Advair 100/50, occasional Astelin, albuterol  inhaler, as noted.   DRUG INTOLERANCE:  CODEINE, SEPTRA.   OBJECTIVE:  VITAL SIGNS:  Weight 108 pounds.  BP 154/78, pulse regular at  82, room air saturation 95%.  GENERAL:  She looks comfortable enough with some watery sniffling, pale  nasal mucosa.  HEENT:  No postnasal drainage seen.  No conjunctival injection.  CHEST:  Her chest is quiet and clear.  CARDIAC:  Heart sounds are regular without murmur.  LYMPH:  There is no adenopathy.   IMPRESSION:  1. Allergic rhinitis with exacerbation.  2. Stable asthma with chronic obstructive pulmonary disease.   PLAN:  1. Rescheduling a pulmonary function test.  2. First try increasing the Allegra 60 mg to t.i.d.  If that does not help      her nasal drainage, then      she will try switching to a prescription for Zyrtec 10 mg daily.      Medication talks done.  3. Schedule return in six months, earlier p.r.n.                                  Clinton D. Maple Hudson, MD, [facility], FACP   CDY/MedQ  DD:  03/04/2006 DT:  03/06/2006 Job #:  [id number]   cc:   Donia Guiles, M.D.

## 2010-10-27 NOTE — Assessment & Plan Note (Signed)
[facility]                             PULMONARY OFFICE NOTE   NAME:Rhonda Bryant, Schmelter                    MRN:          [id number]  DATE:09/02/2006                            DOB:          Aug 24, 1924    PROBLEM LIST:  1. Asthma/chronic obstructive pulmonary disease.  2. Rhinitis.  3. Mastectomy.  4. Osteoporosis.  5. Hypertension.   HISTORY:  She did fine through the winter with no bad spells and credits  drinking green tea for having a strong immune system.  She continues  allergy vaccine here at 1:10 anticipating fall ragweed season as her  worst time of year.  Springtime is variable.  Occasional minor blood  speck from her nose.   MEDICATIONS:  1. Allergy vaccine.  2. Nasonex.  3. Calcitriol.  4. Norvasc 10 mg.  5. Fosamax 70 mg.  6. Allegra 60 mg b.i.d. p.r.n.  7. Accolate 20 mg b.i.d.  8. Advair 100/50.  9. Astelin p.r.n.  10.Extra strength Tylenol p.r.n.  11.Albuterol inhaler p.r.n.  12.Alprazolam p.r.n.   DRUG INTOLERANCE:  1. CODEINE.  2. SEPTRA.   OBJECTIVE:  VITAL SIGNS:  Weight 105 pounds.  Blood pressure 138/68.  Pulse regular 83.  Room air saturation 99%.  GENERAL:  Thin woman who looks comfortable.  LUNG FIELDS:  Clear.  Work of breathing is not increased.  HEART SOUNDS:  Regular.  I do not hear a murmur or gallop.  NASAL MUCOSA:  A little reddened and irritated in appearance with some  mucus bridging but no erosion or polyps.  No postnasal drip.  I find no adenopathy.   IMPRESSION:  Allergic rhinitis and allergic asthma/chronic obstructive  pulmonary disease, currently controlled.   PLAN:  Continue present therapy.  Schedule return in six months but  earlier p.r.n.     Clinton D. Maple Hudson, MD, [facility], FACP  Electronically Signed    CDY/MedQ  DD: 09/08/2006  DT: 09/08/2006  Job #: [id number]   cc:   Donia Guiles, M.D.

## 2010-11-10 ENCOUNTER — Ambulatory Visit (INDEPENDENT_AMBULATORY_CARE_PROVIDER_SITE_OTHER): Payer: Medicare Other

## 2010-11-10 DIAGNOSIS — J309 Allergic rhinitis, unspecified: Secondary | ICD-10-CM

## 2010-11-13 ENCOUNTER — Other Ambulatory Visit: Payer: Self-pay | Admitting: Family Medicine

## 2010-11-13 DIAGNOSIS — Z853 Personal history of malignant neoplasm of breast: Secondary | ICD-10-CM

## 2010-11-13 DIAGNOSIS — N6310 Unspecified lump in the right breast, unspecified quadrant: Secondary | ICD-10-CM

## 2010-11-17 ENCOUNTER — Encounter: Payer: Self-pay | Admitting: Internal Medicine

## 2010-11-20 ENCOUNTER — Ambulatory Visit: Payer: Self-pay | Admitting: Internal Medicine

## 2010-12-08 ENCOUNTER — Telehealth: Payer: Self-pay | Admitting: Internal Medicine

## 2010-12-08 ENCOUNTER — Emergency Department (HOSPITAL_COMMUNITY)
Admission: EM | Admit: 2010-12-08 | Discharge: 2010-12-08 | Disposition: A | Discharge status: Home / Self Care | Payer: Medicare Other | Attending: Emergency Medicine | Admitting: Emergency Medicine

## 2010-12-08 ENCOUNTER — Other Ambulatory Visit: Payer: Self-pay | Admitting: Internal Medicine

## 2010-12-08 ENCOUNTER — Emergency Department (HOSPITAL_COMMUNITY): Payer: Medicare Other

## 2010-12-08 DIAGNOSIS — Z79899 Other long term (current) drug therapy: Secondary | ICD-10-CM | POA: Insufficient documentation

## 2010-12-08 DIAGNOSIS — R070 Pain in throat: Secondary | ICD-10-CM | POA: Insufficient documentation

## 2010-12-08 DIAGNOSIS — R0989 Other specified symptoms and signs involving the circulatory and respiratory systems: Secondary | ICD-10-CM | POA: Insufficient documentation

## 2010-12-08 DIAGNOSIS — J40 Bronchitis, not specified as acute or chronic: Secondary | ICD-10-CM | POA: Insufficient documentation

## 2010-12-08 DIAGNOSIS — R0609 Other forms of dyspnea: Secondary | ICD-10-CM | POA: Insufficient documentation

## 2010-12-08 DIAGNOSIS — R05 Cough: Secondary | ICD-10-CM | POA: Insufficient documentation

## 2010-12-08 DIAGNOSIS — R509 Fever, unspecified: Secondary | ICD-10-CM | POA: Insufficient documentation

## 2010-12-08 DIAGNOSIS — Z853 Personal history of malignant neoplasm of breast: Secondary | ICD-10-CM | POA: Insufficient documentation

## 2010-12-08 DIAGNOSIS — J4489 Other specified chronic obstructive pulmonary disease: Secondary | ICD-10-CM | POA: Insufficient documentation

## 2010-12-08 DIAGNOSIS — R059 Cough, unspecified: Secondary | ICD-10-CM | POA: Insufficient documentation

## 2010-12-08 DIAGNOSIS — J449 Chronic obstructive pulmonary disease, unspecified: Secondary | ICD-10-CM | POA: Insufficient documentation

## 2010-12-08 LAB — CBC
HCT: 36.4 % (ref 36.0–46.0)
MCH: 30.8 pg (ref 26.0–34.0)
MCV: 90.3 fL (ref 78.0–100.0)
RBC: 4.03 MIL/uL (ref 3.87–5.11)
WBC: 12 10*3/uL — ABNORMAL HIGH (ref 4.0–10.5)

## 2010-12-08 LAB — BASIC METABOLIC PANEL
BUN: 13 mg/dL (ref 6–23)
Calcium: 9 mg/dL (ref 8.4–10.5)
Chloride: 98 mEq/L (ref 96–112)
Potassium: 3.7 mEq/L (ref 3.5–5.1)

## 2010-12-08 LAB — DIFFERENTIAL
Basophils Absolute: 0 10*3/uL (ref 0.0–0.1)
Basophils Relative: 0 % (ref 0–1)
Eosinophils Absolute: 0.1 10*3/uL (ref 0.0–0.7)
Eosinophils Relative: 1 % (ref 0–5)
Lymphs Abs: 1.7 10*3/uL (ref 0.7–4.0)
Monocytes Absolute: 1.1 10*3/uL — ABNORMAL HIGH (ref 0.1–1.0)
Monocytes Relative: 9 % (ref 3–12)
Neutro Abs: 9.1 10*3/uL — ABNORMAL HIGH (ref 1.7–7.7)

## 2010-12-08 MED ORDER — ALBUTEROL SULFATE HFA 108 (90 BASE) MCG/ACT IN AERS: 2.0000 | INHALATION_SPRAY | Freq: Four times a day (QID) | RESPIRATORY_TRACT | Status: DC | PRN

## 2010-12-08 NOTE — Telephone Encounter (Signed)
LMTCB. Pt should had followed up with CY in June 2012-will need to make and keep appt with CY; okay to send refill for one time only of requested medication.

## 2010-12-08 NOTE — Telephone Encounter (Signed)
PATIENT HAS BEEN SICK FOR 3 WEEKS.  SHE HAS SCHEDULED APPOINTMENT FOR 01/09/11.

## 2010-12-08 NOTE — Telephone Encounter (Signed)
Refill sent. Pt aware.Rhonda Bryant, CMA  

## 2010-12-15 ENCOUNTER — Inpatient Hospital Stay (HOSPITAL_COMMUNITY)
Admission: EM | Admit: 2010-12-15 | Discharge: 2010-12-17 | DRG: 194 | Disposition: A | Discharge status: Home / Self Care | Payer: Medicare Other | Attending: Family Medicine | Admitting: Family Medicine

## 2010-12-15 ENCOUNTER — Emergency Department (HOSPITAL_COMMUNITY): Payer: Medicare Other

## 2010-12-15 DIAGNOSIS — J441 Chronic obstructive pulmonary disease with (acute) exacerbation: Secondary | ICD-10-CM | POA: Diagnosis present

## 2010-12-15 DIAGNOSIS — Z87891 Personal history of nicotine dependence: Secondary | ICD-10-CM

## 2010-12-15 DIAGNOSIS — Z853 Personal history of malignant neoplasm of breast: Secondary | ICD-10-CM

## 2010-12-15 DIAGNOSIS — E785 Hyperlipidemia, unspecified: Secondary | ICD-10-CM | POA: Diagnosis present

## 2010-12-15 DIAGNOSIS — R0789 Other chest pain: Secondary | ICD-10-CM | POA: Diagnosis present

## 2010-12-15 DIAGNOSIS — F411 Generalized anxiety disorder: Secondary | ICD-10-CM | POA: Diagnosis present

## 2010-12-15 DIAGNOSIS — J189 Pneumonia, unspecified organism: Principal | ICD-10-CM | POA: Diagnosis present

## 2010-12-15 DIAGNOSIS — I10 Essential (primary) hypertension: Secondary | ICD-10-CM | POA: Diagnosis present

## 2010-12-15 LAB — URINALYSIS, ROUTINE W REFLEX MICROSCOPIC
Glucose, UA: NEGATIVE mg/dL
Hgb urine dipstick: NEGATIVE
Ketones, ur: NEGATIVE mg/dL
Leukocytes, UA: NEGATIVE
Protein, ur: NEGATIVE mg/dL
Urobilinogen, UA: 0.2 mg/dL (ref 0.0–1.0)

## 2010-12-15 LAB — POCT I-STAT, CHEM 8
BUN: 14 mg/dL (ref 6–23)
Creatinine, Ser: 0.8 mg/dL (ref 0.50–1.10)
TCO2: 29 mmol/L (ref 0–100)

## 2010-12-15 LAB — CBC
Hemoglobin: 12.4 g/dL (ref 12.0–15.0)
MCHC: 33.8 g/dL (ref 30.0–36.0)
WBC: 16 10*3/uL — ABNORMAL HIGH (ref 4.0–10.5)

## 2010-12-15 LAB — DIFFERENTIAL
Basophils Absolute: 0 10*3/uL (ref 0.0–0.1)
Basophils Relative: 0 % (ref 0–1)
Eosinophils Absolute: 0.1 10*3/uL (ref 0.0–0.7)
Eosinophils Relative: 1 % (ref 0–5)
Lymphocytes Relative: 16 % (ref 12–46)
Monocytes Absolute: 1.3 10*3/uL — ABNORMAL HIGH (ref 0.1–1.0)
Monocytes Relative: 8 % (ref 3–12)

## 2010-12-15 LAB — CK TOTAL AND CKMB (NOT AT ARMC)
CK, MB: 0.8 ng/mL (ref 0.3–4.0)
Relative Index: INVALID (ref 0.0–2.5)
Total CK: 23 U/L (ref 7–177)

## 2010-12-16 ENCOUNTER — Inpatient Hospital Stay (HOSPITAL_COMMUNITY): Payer: Medicare Other

## 2010-12-16 LAB — CARDIAC PANEL(CRET KIN+CKTOT+MB+TROPI)
CK, MB: 1.2 ng/mL (ref 0.3–4.0)
CK, MB: 1.3 ng/mL (ref 0.3–4.0)
Total CK: 22 U/L (ref 7–177)

## 2010-12-16 LAB — TSH: TSH: 1.5 u[IU]/mL (ref 0.350–4.500)

## 2010-12-16 LAB — URINE CULTURE: Colony Count: 15000

## 2010-12-16 LAB — CBC
MCH: 30.9 pg (ref 26.0–34.0)
MCHC: 34.8 g/dL (ref 30.0–36.0)
Platelets: 287 10*3/uL (ref 150–400)
RDW: 12.8 % (ref 11.5–15.5)

## 2010-12-16 LAB — BASIC METABOLIC PANEL
Calcium: 8.5 mg/dL (ref 8.4–10.5)
Sodium: 135 mEq/L (ref 135–145)

## 2010-12-16 LAB — LIPID PANEL
Total CHOL/HDL Ratio: 2.5 RATIO
VLDL: 21 mg/dL (ref 0–40)

## 2010-12-16 LAB — D-DIMER, QUANTITATIVE: D-Dimer, Quant: 0.62 ug/mL-FEU — ABNORMAL HIGH (ref 0.00–0.48)

## 2010-12-16 MED ORDER — IOHEXOL 300 MG/ML  SOLN
80.0000 mL | Freq: Once | INTRAMUSCULAR | Status: AC | PRN
  Administered 2010-12-16: 80 mL via INTRAVENOUS

## 2010-12-17 LAB — BASIC METABOLIC PANEL
BUN: 6 mg/dL (ref 6–23)
Chloride: 103 mEq/L (ref 96–112)
Creatinine, Ser: <0.47 mg/dL — ABNORMAL LOW (ref 0.50–1.10)

## 2010-12-19 NOTE — Discharge Summary (Signed)
NAMEDIVINITY, KYLER NO.:  [id number]  MEDICAL RECORD NO.:  [id number]  LOCATION:  4505                         FACILITY:  [facility]  PHYSICIAN:  Standley Dakins, MD   DATE OF BIRTH:  [birth year]  DATE OF ADMISSION:  12/15/2010 DATE OF DISCHARGE:  12/17/2010                              DISCHARGE SUMMARY   PRIMARY CARE PHYSICIAN:  Cam Hai, C.N.M.  PULMONOLOGIST:  Rennis Chris. Maple Hudson, MD, FCCP, FACP  The patient is to follow up tomorrow.  DISCHARGE DIAGNOSES: 1. Atypical pneumonia. 2. Chronic obstructive pulmonary disease exacerbation. 3. Atypical chest pain - resolved. 4. Hypertension. 5. History of left breast cancer, status post mastectomy and hormone     treatment. 6. Anxiety disorder.  TESTS PERFORMED IN THE HOSPITAL:  Imaging Studies: 1. CT angio of the chest negative for pulmonary embolism and revealed     patchy opacities in the right middle lobe consistent with pneumonia     and bronchiolitis. 2. Portable chest x-ray significant for COPD.  DISCHARGE MEDICATIONS: 1. Avelox 400 mg 1 p.o. daily.  Continue for an additional 3 days. 2. Mucinex 600 mg tablets 1 p.o. twice daily as needed for chest     congestion. 3. Aspirin 81 mg enteric-coated 1 p.o. daily. 4. Acetaminophen 500 mg 1 p.o. by mouth twice daily as needed for     pain. 5. Advair Diskus 100/50 one puff inhaled daily. 6. Albuterol inhaler 2 puffs inhaled 4 times daily as needed for     wheezing, shortness of breath. 7. Fexofenadine 60 mg 1 tablet by mouth twice daily. 8. Alprazolam 0.25 mg 1 tablet by mouth daily as needed. 9. Astelin nasal spray 1 spray nasally twice daily as needed. 10.Glucosamine OTC 1 tablet by mouth daily. 11.Multivitamins 1 p.o. daily. 12.Nasonex nasal spray 1 spray per nostril daily.  HOSPITAL COURSE:  Briefly, this patient is an 77 year old female who presented to the emergency department complaining of shortness of breath, chest congestion and substernal  chest pain.  She was admitted into the hospital and ruled out for myocardial infarction with serial cardiac enzymes that were negative for any myocardial injury.  She was noted to have an elevated D-dimer that was mildly elevated.  She did have a CT angiogram of the chest and ruled out pulmonary embolus, but it also reported that she had an atypical pneumonia.  She was treated with Avelox at that point and responded very well.  Her symptoms improved considerably.    She initially had nausea and vomiting and that improved considerably to the point of resolution on the day of discharge.  She tolerated normal diet and her symptoms had completely resolved.  Her lungs were clear.  She was treated with aggressive nebulizer treatments initially but then titrated to albuterol p.r.n. nebs.  The patient's lungs were clear bilaterally, and she was given Mucinex for chest congestion and her symptoms improved considerably.  She has an appointment with her primary care physician tomorrow.  I have recommended that she keep that appointment and see her primary care provider.  DISCHARGE CONDITION:  Stable.  DISPOSITION:  The patient will be discharged home with her husband.  ACTIVITY:  Ad lib.  DIET:  Cardiac diet recommended.  FOLLOWUP: 1. Follow up with Dr. Cam Hai, her primary care physician     tomorrow at 12:15 as scheduled. 2. Follow up with her pulmonologist, Dr. Jetty Duhamel in 2 weeks.  SPECIAL INSTRUCTIONS: 1. Return if symptoms recur, worsen or new changes develop. 2. Discuss continued aspirin use with the primary care physician. 3. Avoid staying outside and extreme weather conditions including     heat, humidity and severe cold conditions. 4. Avoid tobacco exposure. 5. The patient was examined on the day of discharge.  She was awake,     eating well, tolerating food.  No nausea or vomiting and no     respiratory problems at all.  Her lungs were clear  bilaterally.  PHYSICAL EXAMINATION:  VITAL SIGNS:  Reviewed as the patient was evaluated on December 17, 2010.  Temperature 97.4, pulse 82, respirations 16, blood pressure 134/64, pulse ox 100% on 2 liters nasal cannula. GENERAL: Well-developed female, awake, alert in no distress. HEENT:  Normocephalic, atraumatic.  Mucous membranes moist and wet. NECK:  Supple.  Thyroid, no nodules or masses palpated.  No JVD. LUNGS:  Bilateral breath sounds clear to auscultation.  No crackles, wheezes or rhonchi heard. CARDIAC:  Normal S1-S2, sounds without murmurs, rubs or gallops. ABDOMEN:  Soft, nondistended, nontender.  No masses palpated. EXTREMITIES:  No pretibial edema, cyanosis or clubbing noted. NEUROLOGICAL :  No focal deficits.  The patient is awake, alert and oriented x3.  LABORATORY TESTS:  Magnesium 2.4, sodium 137, potassium 3.8, chloride 103, CO2 26, BUN 6, creatinine 0.47.  Urine culture, no predominating organisms.  Troponin less than 0.30 x3.  MRSA screen negative.  TSH 1.500, total cholesterol 137, LDL cholesterol 61, HDL 55, triglycerides 106.  IMPRESSION:  The patient is determined to be stable for discharge home. She will have close followup with her primary care physician as above. Please see hospital records for full details of this hospitalization.  I spent 38 minutes preparing this patient's discharge including reviewing medical records, dictated notations and history and physical.  I also spent significant time with dictation and reviewing imaging studies.     Standley Dakins, MD     CJ/MEDQ  D:  12/17/2010  T:  12/17/2010  Job:  [id number]  cc:   Cam Hai, C.N.M. Clinton D. Maple Hudson, MD, [facility], FACP  Electronically Signed by Standley Dakins  on 12/19/2010 06:49:59 PM

## 2010-12-29 ENCOUNTER — Ambulatory Visit (INDEPENDENT_AMBULATORY_CARE_PROVIDER_SITE_OTHER): Payer: Medicare Other

## 2010-12-29 DIAGNOSIS — J309 Allergic rhinitis, unspecified: Secondary | ICD-10-CM

## 2011-01-01 NOTE — H&P (Signed)
[name], Rhonda Bryant NO.:  [id number]  MEDICAL RECORD NO.:  [id number]  LOCATION:  2921                         FACILITY:  [facility]  PHYSICIAN:  Rhonda Bryant, M.D. DATE OF BIRTH:  March 16, 1925  DATE OF ADMISSION:  12/15/2010 DATE OF DISCHARGE:                             HISTORY & PHYSICAL   DATE OF ADMISSION:  December 15, 2010.  PRIMARY CARE PHYSICIAN:  Rhonda Raider, MD  CHIEF COMPLAINT:  Chest pain.  HISTORY OF PRESENT ILLNESS:  This is an 77 year old female who presents to the emergency department with complaints of substernal area chest pain which began after eating in the evening prior to arrival.  The patient describes having discomfort which was worse with breathing.  She denied having any shortness of breath, nausea or vomiting, or diaphoresis.  She denied having any radiation of the pain into her neck, jaw or arms.  The patient does report having bronchitis over the past 4 weeks and states that she was evaluated for this and told that this was a viral infection and she does report that her symptoms did improve. She is now coughing up clear mucus.  She denies having any fevers or chills.  PAST MEDICAL HISTORY:  Significant for 1. COPD. 2. Hypertension. 3. Asthma. 4. History of breast cancer, status post left mastectomy and she     completed 10 years of tamoxifen therapy. 5. She is also status post a tonsillectomy.  MEDICATIONS:  Include Advair Diskus, Allegra, Hydromet, Norvasc, Phenergan, prednisone, Remeron and Zocor.  ALLERGIES AND INTOLERANCES:  CODEINE AND SEPTRA, which cause nausea.  SOCIAL HISTORY:  The patient is a nonsmoker.  She has remote history of smoking.  She quit at age 81.  She is a nondrinker.  She denies any illicit drug usage.  FAMILY HISTORY:  Positive for coronary artery disease in her paternal grandfather.  Positive hypertension in her mother.  No diabetes in her family and her mother had breast cancer.  REVIEW OF  SYSTEMS:  Pertinents mentioned above.  PHYSICAL EXAMINATION FINDINGS:  GENERAL:  This is a thin pleasant elderly 77 year old Caucasian female who is in no acute distress. VITAL SIGNS:  Temperature 98.2, blood pressure 133/62, heart rate 91, respirations 15, O2 saturations 98-99%. HEENT:  Normocephalic, atraumatic.  Pupils equally round and reactive to light.  Extraocular movements are intact.  Funduscopic benign.  There is no scleral icterus.  Nares are patent bilaterally.  Oropharynx is clear. NECK:  Supple, full range of motion.  No thyromegaly, adenopathy, or jugular venous distention. CARDIOVASCULAR:.  Regular rate and rhythm.  No murmurs, gallops or rubs. LUNGS:  Clear to auscultation bilaterally.  No rales, rhonchi or wheezes. ABDOMEN:  Positive bowel sounds, soft, nontender, nondistended.  No hepatosplenomegaly.  No rebound or guarding.  EXTREMITIES:  Without cyanosis, clubbing or edema. NEUROLOGIC:  The patient is alert and oriented x3.  Her cranial nerves are intact.  There are no focal deficits on examination.LABORATORY STUDIES:  White blood cell count 16.0, hemoglobin 12.4, hematocrit 36.7, MCV 90.4, platelets 298, neutrophils 76%, lymphocytes 16%.  Sodium 137, potassium 3.9, chloride 100, CO2 29, BUN 14, creatinine 0.80, and glucose 104.  Total CK 23, CK-MB 0.6 and troponin less  than 0.30.  Urinalysis negative.  Chest x-ray reveals COPD changes, but no acute findings.  EKG reveals a normal sinus rhythm with premature atrial contractions, left ventricular hypertrophy noted, rate is 86.  No acute ST-segment changes otherwise.  ASSESSMENT:  An 77 year old female being admitted with 1. Substernal chest pain. 2. Pleuritic chest pain. 3. Hypertension. 4. Chronic obstructive pulmonary disease. 5. Dyslipidemia.  PLAN:  The patient will be admitted to telemetry area for monitoring. Cardiac enzymes will be performed.  A D-dimer will also be ordered.  If this returns positive a  CT angiogram of the chest will be ordered if it is possible.  The patient will be placed on nitro paste therapy, oxygen and aspirin therapies.  Her regular medications will be further reconciled and further workup will ensue pending results of the patient's clinical course and studies.     Rhonda Bryant, M.D.     HJ/MEDQ  D:  12/16/2010  T:  12/16/2010  Job:  [id number]  cc:   Rhonda Bryant, M.D.  Electronically Signed by Rhonda Bryant M.D. on 01/01/2011 12:06:04 PM

## 2011-01-09 ENCOUNTER — Encounter: Payer: Self-pay | Admitting: Internal Medicine

## 2011-01-09 ENCOUNTER — Ambulatory Visit (INDEPENDENT_AMBULATORY_CARE_PROVIDER_SITE_OTHER): Payer: Medicare Other

## 2011-01-09 ENCOUNTER — Ambulatory Visit (INDEPENDENT_AMBULATORY_CARE_PROVIDER_SITE_OTHER): Payer: Medicare Other | Admitting: Internal Medicine

## 2011-01-09 VITALS — BP 138/60 | HR 102 | Ht 64.0 in | Wt 90.0 lb

## 2011-01-09 DIAGNOSIS — J189 Pneumonia, unspecified organism: Secondary | ICD-10-CM

## 2011-01-09 DIAGNOSIS — B999 Unspecified infectious disease: Secondary | ICD-10-CM

## 2011-01-09 DIAGNOSIS — J309 Allergic rhinitis, unspecified: Secondary | ICD-10-CM

## 2011-01-09 DIAGNOSIS — J301 Allergic rhinitis due to pollen: Secondary | ICD-10-CM

## 2011-01-09 DIAGNOSIS — J449 Chronic obstructive pulmonary disease, unspecified: Secondary | ICD-10-CM

## 2011-01-09 NOTE — Assessment & Plan Note (Addendum)
Probably viral bronchopneumonia. She is now finished antibitotics and slowly recovering.CT did not show neoplasm and she does not have known thyroid or diabetic problem to cause the weight loss she has experienced prior to the acute illness. She will f/u with Dr Clelia Croft on this issue.  We discussed association of inhaled steroid meds like Advair with increased pneumonia incidence, but don't feel need to change now. Consider Spiriva trial later. Encourage walking for endurance.

## 2011-01-09 NOTE — Patient Instructions (Signed)
Continue regular meds. Try to walk regularly as you rebuild your strength     Please call as needed.

## 2011-01-09 NOTE — Progress Notes (Signed)
Subjective:    Patient ID: Rhonda Bryant, female    DOB: 12-28-1924, 77 y.o.   MRN: [id number]  HPI 01/09/11- 64 yoF followed for COPD, Allergic rhinitis   Dr Cam Hai Last here 05/22/2010- note reviewed Hosp 7/6-12/17/10 in June with bronchopneumonia per CT 12/16/10, treated Avelox. She had felt some fever and chills, coughing scant dark sputum.   Still feels washed out and anorectic. Additional stress as she has moved from hose to apartment. Husband is helping. Denies reflux and had not been sick acutely prior to this. Had felt tired, and weight had gone down some in preceding months. Remains unusually anxious. No hx thyroid or DM Chest now feels normal on her routine meds. Still a little dry cough. Has had pneumovax more than once.   Review of Systems Constitutional:   No-acute  weight loss, night sweats, fevers, chills, fatigue, lassitude. HEENT:   No-   headaches, difficulty swallowing, tooth/dental problems, sore throat,                  No-   sneezing, itching, ear ache, nasal congestion, post nasal drip,   CV:  No-   chest pain, orthopnea, PND, swelling in lower extremities, anasarca, dizziness, palpitations  GI:  No-   heartburn, indigestion, abdominal pain, nausea, vomiting, diarrhea,                 change in bowel habits, loss of appetite  Resp: No- acute  shortness of breath with exertion or at rest.  No-  excess mucus,             No-   productive cough, + some non-productive cough,  No-  coughing up of blood.              No-   change in color of mucus.  No- wheezing.    Skin: No-   rash or lesions.  GU: No-   dysuria, change in color of urine, no urgency or frequency.  No- flank pain.  MS:  No-   joint pain or swelling.  No- decreased range of motion.  No- back pain.  Psych:  No- change in mood or affect.   No memory loss.      Objective:   Physical Exam General- Alert, Oriented, Affect-appropriate, Distress- none acute,   thin Skin- rash-none, lesions- none,  excoriation- none Lymphadenopathy- none Head- atraumatic            Eyes- Gross vision intact, PERRLA, conjunctivae clear secretions            Ears- Hearing, canals normal            Nose- Clear, no-Septal dev, mucus, polyps, erosion, perforation             Throat- Mallampati II , mucosa clear , drainage- none, tonsils- atrophic Neck- flexible , trachea midline, no stridor , thyroid nl, carotid no bruit Chest - symmetrical excursion , unlabored           Heart/CV- RRR , no murmur , no gallop  , no rub, nl s1 s2                           - JVD- none , edema- none, stasis changes- none, varices- none           Lung- clear to P&A, distant , wheeze- none, cough- none , dullness-none, rub- none  Chest wall-  Abd- tender-no, distended-no, bowel sounds-present, HSM- no Br/ Gen/ Rectal- Not done, not indicated Extrem- cyanosis- none, clubbing, none, atrophy- none, strength- nl Neuro- grossly intact to observation         Assessment & Plan:   No problem-specific assessment & plan notes found for this encounter.

## 2011-01-13 NOTE — Assessment & Plan Note (Signed)
Not an active season for her problem currently. She uses antihistamines as needed during the fall.

## 2011-02-16 ENCOUNTER — Ambulatory Visit (INDEPENDENT_AMBULATORY_CARE_PROVIDER_SITE_OTHER): Payer: Medicare Other

## 2011-02-16 DIAGNOSIS — J309 Allergic rhinitis, unspecified: Secondary | ICD-10-CM

## 2011-03-09 ENCOUNTER — Ambulatory Visit (INDEPENDENT_AMBULATORY_CARE_PROVIDER_SITE_OTHER): Payer: Medicare Other

## 2011-03-09 DIAGNOSIS — J309 Allergic rhinitis, unspecified: Secondary | ICD-10-CM

## 2011-03-16 ENCOUNTER — Ambulatory Visit (INDEPENDENT_AMBULATORY_CARE_PROVIDER_SITE_OTHER): Payer: Medicare Other

## 2011-03-16 DIAGNOSIS — J309 Allergic rhinitis, unspecified: Secondary | ICD-10-CM

## 2011-03-30 ENCOUNTER — Ambulatory Visit (INDEPENDENT_AMBULATORY_CARE_PROVIDER_SITE_OTHER): Payer: Medicare Other

## 2011-03-30 DIAGNOSIS — Z23 Encounter for immunization: Secondary | ICD-10-CM

## 2011-03-30 DIAGNOSIS — J309 Allergic rhinitis, unspecified: Secondary | ICD-10-CM

## 2011-04-19 ENCOUNTER — Ambulatory Visit (INDEPENDENT_AMBULATORY_CARE_PROVIDER_SITE_OTHER): Payer: Medicare Other

## 2011-04-19 DIAGNOSIS — J309 Allergic rhinitis, unspecified: Secondary | ICD-10-CM

## 2011-04-25 ENCOUNTER — Encounter: Payer: Self-pay | Admitting: Internal Medicine

## 2011-06-01 ENCOUNTER — Ambulatory Visit (INDEPENDENT_AMBULATORY_CARE_PROVIDER_SITE_OTHER): Payer: Medicare Other

## 2011-06-01 DIAGNOSIS — J309 Allergic rhinitis, unspecified: Secondary | ICD-10-CM

## 2011-06-15 ENCOUNTER — Ambulatory Visit (INDEPENDENT_AMBULATORY_CARE_PROVIDER_SITE_OTHER): Payer: Medicare Other

## 2011-06-15 DIAGNOSIS — J309 Allergic rhinitis, unspecified: Secondary | ICD-10-CM

## 2011-06-29 ENCOUNTER — Other Ambulatory Visit: Payer: Self-pay | Admitting: Internal Medicine

## 2011-07-10 ENCOUNTER — Ambulatory Visit (INDEPENDENT_AMBULATORY_CARE_PROVIDER_SITE_OTHER): Payer: Medicare Other

## 2011-07-10 ENCOUNTER — Encounter: Payer: Self-pay | Admitting: Internal Medicine

## 2011-07-10 ENCOUNTER — Ambulatory Visit (INDEPENDENT_AMBULATORY_CARE_PROVIDER_SITE_OTHER): Payer: Medicare Other | Admitting: Internal Medicine

## 2011-07-10 DIAGNOSIS — J309 Allergic rhinitis, unspecified: Secondary | ICD-10-CM

## 2011-07-10 DIAGNOSIS — J301 Allergic rhinitis due to pollen: Secondary | ICD-10-CM

## 2011-07-10 DIAGNOSIS — J449 Chronic obstructive pulmonary disease, unspecified: Secondary | ICD-10-CM

## 2011-07-10 NOTE — Patient Instructions (Signed)
Continue present treatment - please call as needed 

## 2011-07-10 NOTE — Assessment & Plan Note (Signed)
Good ongoing control. She is satisfied to continue her allergy vaccine

## 2011-07-10 NOTE — Assessment & Plan Note (Signed)
She is controlled only needing low-dose Advair now. She was able to get over a cold earlier this winter without significant exacerbation. We don't need to change therapy.

## 2011-07-10 NOTE — Progress Notes (Signed)
Patient ID: Rhonda Bryant, female    DOB: Sep 14, 1924, 78 y.o.   MRN: [id number]  HPI 01/09/11- 5 yoF followed for COPD, Allergic rhinitis   Dr Cam Hai Last here 05/22/2010- note reviewed Hosp 7/6-12/17/10 in June with bronchopneumonia per CT 12/16/10, treated Avelox. She had felt some fever and chills, coughing scant dark sputum.   Still feels washed out and anorectic. Additional stress as she has moved from hose to apartment. Husband is helping. Denies reflux and had not been sick acutely prior to this. Had felt tired, and weight had gone down some in preceding months. Remains unusually anxious. No hx thyroid or DM Chest now feels normal on her routine meds. Still a little dry cough. Has had pneumovax more than once.   07/10/11- 86 yoF followed for COPD, Allergic rhinitis   Dr Cam Hai Had flu vaccine. Used a Z-Pak while getting over a chest cold in early December but otherwise has done well with no acute respiratory illness. Today she feels "fine". Says she is doing well with allergy vaccine. Using a saline nasal gel which reduces epistaxis. Using Advair once daily and almost never needs her rescue inhaler.  ROS-see HPI Constitutional:   No-   weight loss, night sweats, fevers, chills, fatigue, lassitude. HEENT:   No-  headaches, difficulty swallowing, tooth/dental problems, sore throat,       No-  sneezing, itching, ear ache, nasal congestion, post nasal drip,  CV:  No-   chest pain, orthopnea, PND, swelling in lower extremities, anasarca, dizziness, palpitations Resp: No-   shortness of breath with exertion or at rest.              No-   productive cough,  No non-productive cough,  No- coughing up of blood.              No-   change in color of mucus.  No- wheezing.   Skin: No-   rash or lesions. GI:  No-   heartburn, indigestion, abdominal pain, nausea, vomiting, diarrhea,                 change in bowel habits, loss of appetite GU:  MS:  No-   joint pain or swelling.  No-  decreased range of motion.  No- back pain. Neuro-     nothing unusual Psych:  No- change in mood or affect. No depression or anxiety.  No memory loss.        Objective:   Physical Exam General- Alert, Oriented, Affect-appropriate, Distress- none acute,   thin Skin- rash-none, lesions- none, excoriation- none Lymphadenopathy- none Head- atraumatic            Eyes- Gross vision intact, PERRLA, strabismus, conjunctivae clear secretions            Ears- Hearing, canals normal            Nose- Clear, no-Septal dev, mucus, polyps, erosion, perforation             Throat- Mallampati II , mucosa clear , drainage- none, tonsils- atrophic Neck- flexible , trachea midline, no stridor , thyroid nl, carotid no bruit Chest - symmetrical excursion , unlabored           Heart/CV- RRR , no murmur , no gallop  , no rub, nl s1 s2                           - JVD-  none , edema- none, stasis changes- none, varices- none           Lung- clear to P&A, distant , wheeze- none, cough- none , dullness-none, rub- none           Chest wall-  Abd- Br/ Gen/ Rectal- Not done, not indicated Extrem- cyanosis- none, clubbing, none, atrophy- none, strength- nl Neuro- grossly intact to observation

## 2011-07-16 ENCOUNTER — Other Ambulatory Visit: Payer: Self-pay | Admitting: Internal Medicine

## 2011-07-27 ENCOUNTER — Ambulatory Visit (INDEPENDENT_AMBULATORY_CARE_PROVIDER_SITE_OTHER): Payer: Medicare Other

## 2011-07-27 DIAGNOSIS — J309 Allergic rhinitis, unspecified: Secondary | ICD-10-CM

## 2011-08-10 ENCOUNTER — Ambulatory Visit (INDEPENDENT_AMBULATORY_CARE_PROVIDER_SITE_OTHER): Payer: Medicare Other

## 2011-08-10 DIAGNOSIS — J309 Allergic rhinitis, unspecified: Secondary | ICD-10-CM

## 2011-09-12 ENCOUNTER — Ambulatory Visit (INDEPENDENT_AMBULATORY_CARE_PROVIDER_SITE_OTHER): Payer: Medicare Other

## 2011-09-12 DIAGNOSIS — J309 Allergic rhinitis, unspecified: Secondary | ICD-10-CM

## 2011-10-19 ENCOUNTER — Ambulatory Visit (INDEPENDENT_AMBULATORY_CARE_PROVIDER_SITE_OTHER): Payer: Medicare Other

## 2011-10-19 DIAGNOSIS — J309 Allergic rhinitis, unspecified: Secondary | ICD-10-CM

## 2011-11-08 ENCOUNTER — Other Ambulatory Visit: Payer: Self-pay | Admitting: Internal Medicine

## 2011-11-09 ENCOUNTER — Ambulatory Visit (INDEPENDENT_AMBULATORY_CARE_PROVIDER_SITE_OTHER): Payer: Medicare Other

## 2011-11-09 DIAGNOSIS — J309 Allergic rhinitis, unspecified: Secondary | ICD-10-CM

## 2011-11-23 ENCOUNTER — Ambulatory Visit (INDEPENDENT_AMBULATORY_CARE_PROVIDER_SITE_OTHER): Payer: Medicare Other

## 2011-11-23 DIAGNOSIS — J309 Allergic rhinitis, unspecified: Secondary | ICD-10-CM

## 2011-12-07 ENCOUNTER — Ambulatory Visit (INDEPENDENT_AMBULATORY_CARE_PROVIDER_SITE_OTHER): Payer: Medicare Other

## 2011-12-07 DIAGNOSIS — J309 Allergic rhinitis, unspecified: Secondary | ICD-10-CM

## 2012-01-08 ENCOUNTER — Ambulatory Visit (INDEPENDENT_AMBULATORY_CARE_PROVIDER_SITE_OTHER): Payer: Medicare Other

## 2012-01-08 ENCOUNTER — Encounter: Payer: Self-pay | Admitting: Internal Medicine

## 2012-01-08 ENCOUNTER — Ambulatory Visit (INDEPENDENT_AMBULATORY_CARE_PROVIDER_SITE_OTHER): Payer: Medicare Other | Admitting: Internal Medicine

## 2012-01-08 VITALS — BP 130/68 | HR 93 | Ht 64.0 in | Wt 101.6 lb

## 2012-01-08 DIAGNOSIS — J309 Allergic rhinitis, unspecified: Secondary | ICD-10-CM

## 2012-01-08 DIAGNOSIS — J45998 Other asthma: Secondary | ICD-10-CM

## 2012-01-08 DIAGNOSIS — J301 Allergic rhinitis due to pollen: Secondary | ICD-10-CM

## 2012-01-08 DIAGNOSIS — J45909 Unspecified asthma, uncomplicated: Secondary | ICD-10-CM

## 2012-01-08 MED ORDER — ALBUTEROL SULFATE HFA 108 (90 BASE) MCG/ACT IN AERS: 2.0000 | INHALATION_SPRAY | Freq: Four times a day (QID) | RESPIRATORY_TRACT | Status: DC | PRN

## 2012-01-08 MED ORDER — FLUTICASONE-SALMETEROL 100-50 MCG/DOSE IN AEPB: 1.0000 | INHALATION_SPRAY | Freq: Two times a day (BID) | RESPIRATORY_TRACT | Status: DC

## 2012-01-08 NOTE — Patient Instructions (Addendum)
Refill scripts for Advair maintenance controller, and for albuterol rescue inhaler.  Please call as needed  Ok to continue allergy vaccine

## 2012-01-08 NOTE — Progress Notes (Signed)
Patient ID: Rhonda Bryant, female    DOB: 08/28/1924, 78 y.o.   MRN: [id number]  HPI 01/09/11- 25 yoF followed for COPD, Allergic rhinitis   Dr Cam Hai Last here 05/22/2010- note reviewed Hosp 7/6-12/17/10 in June with bronchopneumonia per CT 12/16/10, treated Avelox. She had felt some fever and chills, coughing scant dark sputum.   Still feels washed out and anorectic. Additional stress as she has moved from hose to apartment. Husband is helping. Denies reflux and had not been sick acutely prior to this. Had felt tired, and weight had gone down some in preceding months. Remains unusually anxious. No hx thyroid or DM Chest now feels normal on her routine meds. Still a little dry cough. Has had pneumovax more than once.   07/10/11- 86 yoF followed for COPD, Allergic rhinitis    Had flu vaccine. Used a Z-Pak while getting over a chest cold in early December but otherwise has done well with no acute respiratory illness. Today she feels "fine". Says she is doing well with allergy vaccine. Using a saline nasal gel which reduces epistaxis. Using Advair once daily and almost never needs her rescue inhaler.  01/08/12- 87 yoF followed for COPD, Allergic rhinitis  :"doing good" denies any wheezing, cough, congestion, or  SOB COPD assessment test (CAT) 6/40 She continues allergy vaccine without problems at 1:10. She expresses concern about exposure to mold or dust. 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 She denies any wheezing and admits only a little cough. CT chest 12/16/10 ( after pneumonia in June) reviewed. IMPRESSION:  No evidence of pulmonary embolism.  Patchy opacities in the right middle lobe, as well as scattered  mild opacities in the lingula and bilateral lower lobes, possibly  reflecting a very mild pneumonia or bronchiolitis.  Original Report Authenticated By: Charline Bills, M.D.   ROS-see HPI Constitutional:   No-   weight loss, night sweats, fevers, chills, fatigue, lassitude. HEENT:   No-  headaches, difficulty swallowing, tooth/dental problems, sore throat,       No-  sneezing, itching, ear ache, nasal congestion, post nasal drip,  CV:  No-   chest pain, orthopnea, PND, swelling in lower extremities, anasarca, dizziness, palpitations Resp: No-   shortness of breath with exertion or at rest.              No-   productive cough,  No non-productive cough,  No- coughing up of blood.              No-   change in color of mucus.  No- wheezing.   Skin: No-   rash or lesions. GI:  No-   heartburn, indigestion, abdominal pain, nausea, vomiting,  GU:  MS:  No-   joint pain or swelling.   Neuro-     nothing unusual Psych:  No- change in mood or affect. No depression or anxiety.  No memory loss.  Objective:   Physical Exam General- Alert, Oriented, Affect-appropriate, Distress- none acute,   thin Skin- rash-none, lesions- none, excoriation- none Lymphadenopathy- none Head- atraumatic            Eyes- Gross vision intact, PERRLA, strabismus, conjunctivae clear secretions            Ears- Hearing, canals normal            Nose- Clear, no-Septal  dev, mucus, polyps, erosion, perforation             Throat- Mallampati II , mucosa clear , drainage- none, tonsils- atrophic Neck- flexible , trachea midline, no stridor , thyroid nl, carotid no bruit Chest - symmetrical excursion , unlabored           Heart/CV- RRR , no murmur , no gallop  , no rub, nl s1 s2                           - JVD- none , edema- none, stasis changes- none, varices- none           Lung- clear to P&A, distant , wheeze- none, cough- none , dullness-none, rub- none           Chest wall-  Abd- Br/ Gen/ Rectal- Not done, not indicated Extrem- cyanosis- none, clubbing, none, atrophy- none, strength- nl Neuro- grossly intact to observation

## 2012-01-14 NOTE — Assessment & Plan Note (Signed)
She continues to believe her allergy vaccine is helpful. 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 That area is being her mediated. 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 There is probably some dust as she moved them to a different area but she wore a mask.  Plan-continue vaccine.

## 2012-01-14 NOTE — Assessment & Plan Note (Signed)
Good control. Plan refill Advair and albuterol.

## 2012-02-15 ENCOUNTER — Ambulatory Visit (INDEPENDENT_AMBULATORY_CARE_PROVIDER_SITE_OTHER): Payer: Medicare Other

## 2012-02-15 DIAGNOSIS — J309 Allergic rhinitis, unspecified: Secondary | ICD-10-CM

## 2012-02-18 ENCOUNTER — Ambulatory Visit (INDEPENDENT_AMBULATORY_CARE_PROVIDER_SITE_OTHER): Payer: Medicare Other

## 2012-02-18 DIAGNOSIS — J309 Allergic rhinitis, unspecified: Secondary | ICD-10-CM

## 2012-03-07 ENCOUNTER — Ambulatory Visit (INDEPENDENT_AMBULATORY_CARE_PROVIDER_SITE_OTHER): Payer: Medicare Other

## 2012-03-07 DIAGNOSIS — J309 Allergic rhinitis, unspecified: Secondary | ICD-10-CM

## 2012-03-17 ENCOUNTER — Encounter: Payer: Self-pay | Admitting: Internal Medicine

## 2012-03-17 ENCOUNTER — Other Ambulatory Visit: Payer: Self-pay | Admitting: Internal Medicine

## 2012-03-20 ENCOUNTER — Ambulatory Visit (INDEPENDENT_AMBULATORY_CARE_PROVIDER_SITE_OTHER): Payer: Medicare Other

## 2012-03-20 DIAGNOSIS — Z23 Encounter for immunization: Secondary | ICD-10-CM

## 2012-03-20 DIAGNOSIS — J309 Allergic rhinitis, unspecified: Secondary | ICD-10-CM

## 2012-03-21 DIAGNOSIS — Z23 Encounter for immunization: Secondary | ICD-10-CM

## 2012-04-14 ENCOUNTER — Ambulatory Visit
Admission: RE | Admit: 2012-04-14 | Discharge: 2012-04-14 | Disposition: A | Discharge status: Home / Self Care | Payer: Medicare Other | Source: Ambulatory Visit | Attending: Family Medicine | Admitting: Family Medicine

## 2012-04-14 ENCOUNTER — Other Ambulatory Visit: Payer: Self-pay | Admitting: Family Medicine

## 2012-04-14 DIAGNOSIS — T1490XA Injury, unspecified, initial encounter: Secondary | ICD-10-CM

## 2012-06-27 ENCOUNTER — Ambulatory Visit (INDEPENDENT_AMBULATORY_CARE_PROVIDER_SITE_OTHER): Payer: Medicare Other

## 2012-06-27 DIAGNOSIS — J309 Allergic rhinitis, unspecified: Secondary | ICD-10-CM

## 2012-07-04 ENCOUNTER — Ambulatory Visit (INDEPENDENT_AMBULATORY_CARE_PROVIDER_SITE_OTHER): Payer: Medicare Other

## 2012-07-04 DIAGNOSIS — J309 Allergic rhinitis, unspecified: Secondary | ICD-10-CM

## 2012-07-10 ENCOUNTER — Ambulatory Visit: Payer: Medicare Other | Admitting: Internal Medicine

## 2012-07-11 ENCOUNTER — Ambulatory Visit (INDEPENDENT_AMBULATORY_CARE_PROVIDER_SITE_OTHER): Payer: Medicare Other

## 2012-07-11 DIAGNOSIS — J309 Allergic rhinitis, unspecified: Secondary | ICD-10-CM

## 2012-07-18 ENCOUNTER — Ambulatory Visit: Payer: Medicare Other

## 2012-07-31 ENCOUNTER — Ambulatory Visit (INDEPENDENT_AMBULATORY_CARE_PROVIDER_SITE_OTHER): Payer: Medicare Other

## 2012-07-31 ENCOUNTER — Encounter: Payer: Self-pay | Admitting: Internal Medicine

## 2012-07-31 ENCOUNTER — Ambulatory Visit (INDEPENDENT_AMBULATORY_CARE_PROVIDER_SITE_OTHER): Payer: Medicare Other | Admitting: Internal Medicine

## 2012-07-31 ENCOUNTER — Ambulatory Visit (INDEPENDENT_AMBULATORY_CARE_PROVIDER_SITE_OTHER)
Admission: RE | Admit: 2012-07-31 | Discharge: 2012-07-31 | Disposition: A | Discharge status: Home / Self Care | Payer: Medicare Other | Source: Ambulatory Visit | Attending: Internal Medicine | Admitting: Internal Medicine

## 2012-07-31 VITALS — BP 114/66 | HR 76 | Ht 64.0 in | Wt 108.6 lb

## 2012-07-31 DIAGNOSIS — J301 Allergic rhinitis due to pollen: Secondary | ICD-10-CM

## 2012-07-31 DIAGNOSIS — J45909 Unspecified asthma, uncomplicated: Secondary | ICD-10-CM

## 2012-07-31 DIAGNOSIS — J45998 Other asthma: Secondary | ICD-10-CM

## 2012-07-31 DIAGNOSIS — J309 Allergic rhinitis, unspecified: Secondary | ICD-10-CM

## 2012-07-31 NOTE — Progress Notes (Signed)
Patient ID: Rhonda Bryant, female    DOB: July 27, 1924, 79 y.o.   MRN: [id number]  HPI 01/09/11- 79 yoF followed for COPD, Allergic rhinitis   Dr Cam Hai Last here 05/22/2010- note reviewed Hosp 7/6-12/17/10 in June with bronchopneumonia per CT 12/16/10, treated Avelox. She had felt some fever and chills, coughing scant dark sputum.   Still feels washed out and anorectic. Additional stress as she has moved from hose to apartment. Husband is helping. Denies reflux and had not been sick acutely prior to this. Had felt tired, and weight had gone down some in preceding months. Remains unusually anxious. No hx thyroid or DM Chest now feels normal on her routine meds. Still a little dry cough. Has had pneumovax more than once.   07/10/11- 79 yoF followed for COPD, Allergic rhinitis    Had flu vaccine. Used a Z-Pak while getting over a chest cold in early December but otherwise has done well with no acute respiratory illness. Today she feels "fine". Says she is doing well with allergy vaccine. Using a saline nasal gel which reduces epistaxis. Using Advair once daily and almost never needs her rescue inhaler.  01/08/12- 79 yoF followed for COPD, Allergic rhinitis  :"doing good" denies any wheezing, cough, congestion, or  SOB COPD assessment test (CAT) 6/40 She continues allergy vaccine without problems at 1:10. She expresses concern about exposure to mold or dust. 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 She denies any wheezing and admits only a little cough. CT chest 12/16/10 ( after pneumonia in June) reviewed. IMPRESSION:  No evidence of pulmonary embolism.  Patchy opacities in the right middle lobe, as well as scattered  mild opacities in the lingula and bilateral lower lobes, possibly  reflecting a very mild pneumonia or bronchiolitis.  Original Report Authenticated By: Charline Bills, M.D.   07/31/12- 79 yoF former smoker followed for COPD, Allergic rhinitis  FOLLOWS FOR: still on allergy vaccine 1:10 GH and no reactions, having sneezing episodes Viral bronchitis syndrome in January resolved. Occasional sneezing and blowing. She does have Astelin and Nasonex nasal sprays. She feels well enough controlled. We discussed chest CT from July, 2012 and agreed to update chest x-ray.  ROS-see HPI Constitutional:   No-   weight loss, night sweats, fevers, chills, fatigue, lassitude. HEENT:   No-  headaches, difficulty swallowing, tooth/dental problems, sore throat,       No-  sneezing, itching, ear ache, nasal congestion, +post nasal drip,  CV:  No-   chest pain, orthopnea, PND, swelling in lower extremities, anasarca, dizziness, palpitations Resp: No-   shortness of breath with exertion or at rest.              No-   productive cough,  No non-productive cough,  No- coughing up of blood.              No-   change in color of mucus.  No- wheezing.   Skin: No-   rash or lesions. GI:  No-   heartburn, indigestion, abdominal pain, nausea, vomiting,  GU:  MS:  No-   joint pain or swelling.   Neuro-     nothing unusual Psych:  No- change in mood or affect. No depression or anxiety.  No memory loss.  Objective:   Physical Exam General- Alert, Oriented, Affect-appropriate, Distress- none acute,  thin Skin- rash-none, lesions- none, excoriation- none Lymphadenopathy- none Head- atraumatic            Eyes- Gross vision intact, PERRLA, strabismus, conjunctivae clear secretions            Ears- Hearing, canals normal            Nose- Clear, no-Septal dev, mucus, polyps, erosion, perforation             Throat- Mallampati II , mucosa clear , drainage- none, tonsils- atrophic Neck- flexible , trachea midline, no stridor , thyroid nl, carotid no bruit Chest - symmetrical excursion , unlabored           Heart/CV- RRR , no murmur , no gallop  , no rub, nl s1 s2                            - JVD- none , edema- none, stasis changes- none, varices- none           Lung- clear to P&A, + distant , wheeze- none, cough- none , dullness-none, rub- none           Chest wall-  Abd- Br/ Gen/ Rectal- Not done, not indicated Extrem- cyanosis- none, clubbing, none, atrophy- none, strength- nl Neuro- grossly intact to observation

## 2012-07-31 NOTE — Patient Instructions (Addendum)
Order- CXR     Dx asthma  We can continue allergy vaccine 1:10 GH  Please call as needed

## 2012-08-01 NOTE — Assessment & Plan Note (Signed)
Currently good control of the reversible component. There is a fixed chronic obstructive component from old smoking. Plan-chest x-ray

## 2012-08-01 NOTE — Assessment & Plan Note (Signed)
Probably allergic and nonallergic rhinitis. At this time of year, dry indoor heat and virus infections will be more important than pollen.

## 2012-08-08 ENCOUNTER — Ambulatory Visit (INDEPENDENT_AMBULATORY_CARE_PROVIDER_SITE_OTHER): Payer: Medicare Other

## 2012-08-08 DIAGNOSIS — J309 Allergic rhinitis, unspecified: Secondary | ICD-10-CM

## 2012-08-11 NOTE — Progress Notes (Signed)
Quick Note:  LMTCB ______ 

## 2012-08-12 ENCOUNTER — Other Ambulatory Visit: Payer: Self-pay | Admitting: Internal Medicine

## 2012-08-12 DIAGNOSIS — R911 Solitary pulmonary nodule: Secondary | ICD-10-CM

## 2012-08-12 NOTE — Progress Notes (Signed)
Quick Note:  Pt aware of results. Orders placed for CT chest with contrast and BMET. ______

## 2012-08-15 ENCOUNTER — Other Ambulatory Visit: Payer: Medicare Other

## 2012-08-15 ENCOUNTER — Ambulatory Visit: Payer: Medicare Other

## 2012-08-19 ENCOUNTER — Other Ambulatory Visit (INDEPENDENT_AMBULATORY_CARE_PROVIDER_SITE_OTHER): Payer: Medicare Other

## 2012-08-19 DIAGNOSIS — R911 Solitary pulmonary nodule: Secondary | ICD-10-CM

## 2012-08-19 LAB — BASIC METABOLIC PANEL
BUN: 14 mg/dL (ref 6–23)
Calcium: 8.7 mg/dL (ref 8.4–10.5)
Creatinine, Ser: 0.7 mg/dL (ref 0.4–1.2)
GFR: 81.64 mL/min (ref >60.00)
Glucose, Bld: 110 mg/dL — ABNORMAL HIGH (ref 70–99)
Potassium: 3.7 mEq/L (ref 3.5–5.1)

## 2012-08-21 ENCOUNTER — Ambulatory Visit (INDEPENDENT_AMBULATORY_CARE_PROVIDER_SITE_OTHER)
Admission: RE | Admit: 2012-08-21 | Discharge: 2012-08-21 | Disposition: A | Discharge status: Home / Self Care | Payer: Medicare Other | Source: Ambulatory Visit | Attending: Internal Medicine | Admitting: Internal Medicine

## 2012-08-21 DIAGNOSIS — R911 Solitary pulmonary nodule: Secondary | ICD-10-CM

## 2012-08-21 MED ORDER — IOHEXOL 300 MG/ML  SOLN
80.0000 mL | Freq: Once | INTRAMUSCULAR | Status: AC | PRN
  Administered 2012-08-21: 80 mL via INTRAVENOUS

## 2012-08-21 NOTE — Progress Notes (Signed)
Quick Note:  LMTCB ______ 

## 2012-08-22 ENCOUNTER — Telehealth: Payer: Self-pay | Admitting: Internal Medicine

## 2012-08-22 ENCOUNTER — Telehealth: Payer: Self-pay | Admitting: *Deleted

## 2012-08-22 MED ORDER — MOMETASONE FUROATE 50 MCG/ACT NA SUSP: 2.0000 | Freq: Every day | NASAL | Status: DC

## 2012-08-22 MED ORDER — MOMETASONE FUROATE 50 MCG/ACT NA SUSP: NASAL | Status: DC

## 2012-08-22 MED ORDER — AZITHROMYCIN 250 MG PO TABS: ORAL_TABLET | ORAL | Status: DC

## 2012-08-22 NOTE — Telephone Encounter (Signed)
Pt called back for her results.  She is aware of lab and ct results per CY.  Pt voiced her understanding of these results.  Pt stated that at her last ov with CY she was offered a zpak to keep on hand just in case, but she declined at that time.  Pt is now requesting that the zpak be called to her pharmacy just in case she may need this over the weekend.  Per CY---ok to send in zpak just in case.  This has been called to the pts pharmacy.  Pt is aware.

## 2012-08-22 NOTE — Telephone Encounter (Signed)
RX has been sent to the pharmacy. Nothing further was needed 

## 2012-08-29 ENCOUNTER — Ambulatory Visit (INDEPENDENT_AMBULATORY_CARE_PROVIDER_SITE_OTHER): Payer: Medicare Other

## 2012-08-29 DIAGNOSIS — J309 Allergic rhinitis, unspecified: Secondary | ICD-10-CM

## 2012-09-05 ENCOUNTER — Ambulatory Visit (INDEPENDENT_AMBULATORY_CARE_PROVIDER_SITE_OTHER): Payer: Medicare Other

## 2012-09-05 DIAGNOSIS — J309 Allergic rhinitis, unspecified: Secondary | ICD-10-CM

## 2012-09-12 ENCOUNTER — Ambulatory Visit: Payer: Medicare Other

## 2012-10-02 ENCOUNTER — Ambulatory Visit (INDEPENDENT_AMBULATORY_CARE_PROVIDER_SITE_OTHER): Payer: Medicare Other

## 2012-10-02 DIAGNOSIS — J309 Allergic rhinitis, unspecified: Secondary | ICD-10-CM

## 2012-10-03 ENCOUNTER — Ambulatory Visit (INDEPENDENT_AMBULATORY_CARE_PROVIDER_SITE_OTHER): Payer: Medicare Other

## 2012-10-03 DIAGNOSIS — J309 Allergic rhinitis, unspecified: Secondary | ICD-10-CM

## 2012-10-10 ENCOUNTER — Ambulatory Visit: Payer: Medicare Other

## 2012-10-17 ENCOUNTER — Ambulatory Visit (INDEPENDENT_AMBULATORY_CARE_PROVIDER_SITE_OTHER): Payer: Medicare Other

## 2012-10-17 DIAGNOSIS — J309 Allergic rhinitis, unspecified: Secondary | ICD-10-CM

## 2012-11-21 ENCOUNTER — Ambulatory Visit (INDEPENDENT_AMBULATORY_CARE_PROVIDER_SITE_OTHER): Payer: Medicare Other

## 2012-11-21 DIAGNOSIS — J309 Allergic rhinitis, unspecified: Secondary | ICD-10-CM

## 2012-12-01 ENCOUNTER — Telehealth: Payer: Self-pay | Admitting: Internal Medicine

## 2012-12-01 MED ORDER — FLUTICASONE-SALMETEROL 100-50 MCG/DOSE IN AEPB: 1.0000 | INHALATION_SPRAY | Freq: Two times a day (BID) | RESPIRATORY_TRACT | Status: DC

## 2012-12-01 NOTE — Telephone Encounter (Signed)
Rx was electronically sent to pharm

## 2012-12-19 ENCOUNTER — Ambulatory Visit (INDEPENDENT_AMBULATORY_CARE_PROVIDER_SITE_OTHER): Payer: Medicare Other

## 2012-12-19 DIAGNOSIS — J309 Allergic rhinitis, unspecified: Secondary | ICD-10-CM

## 2012-12-26 ENCOUNTER — Ambulatory Visit: Payer: Medicare Other

## 2013-01-29 ENCOUNTER — Ambulatory Visit: Payer: Medicare Other | Admitting: Internal Medicine

## 2013-02-02 ENCOUNTER — Ambulatory Visit (INDEPENDENT_AMBULATORY_CARE_PROVIDER_SITE_OTHER): Payer: Medicare Other | Admitting: Internal Medicine

## 2013-02-02 ENCOUNTER — Encounter: Payer: Self-pay | Admitting: Internal Medicine

## 2013-02-02 ENCOUNTER — Ambulatory Visit (INDEPENDENT_AMBULATORY_CARE_PROVIDER_SITE_OTHER): Payer: Medicare Other

## 2013-02-02 VITALS — BP 112/74 | HR 75 | Ht 64.0 in | Wt 112.0 lb

## 2013-02-02 DIAGNOSIS — J4489 Other specified chronic obstructive pulmonary disease: Secondary | ICD-10-CM

## 2013-02-02 DIAGNOSIS — J309 Allergic rhinitis, unspecified: Secondary | ICD-10-CM

## 2013-02-02 DIAGNOSIS — J301 Allergic rhinitis due to pollen: Secondary | ICD-10-CM

## 2013-02-02 DIAGNOSIS — J310 Chronic rhinitis: Secondary | ICD-10-CM

## 2013-02-02 DIAGNOSIS — J449 Chronic obstructive pulmonary disease, unspecified: Secondary | ICD-10-CM

## 2013-02-02 NOTE — Patient Instructions (Addendum)
For the cough- try otc cough syrup delsym if needed                           Try using the Advair 1 puff then rinse, twice daily, for a week or so to see if the cough gets better.  Please call as needed  We can continue allergy vaccine   1:10 GH

## 2013-02-02 NOTE — Progress Notes (Signed)
Patient ID: Rhonda Bryant, female    DOB: 17-Jan-1925, 79 y.o.   MRN: [id number]  HPI 01/09/11- 65 yoF followed for COPD, Allergic rhinitis   Dr Cam Hai Last here 05/22/2010- note reviewed Hosp 7/6-12/17/10 in June with bronchopneumonia per CT 12/16/10, treated Avelox. She had felt some fever and chills, coughing scant dark sputum.   Still feels washed out and anorectic. Additional stress as she has moved from hose to apartment. Husband is helping. Denies reflux and had not been sick acutely prior to this. Had felt tired, and weight had gone down some in preceding months. Remains unusually anxious. No hx thyroid or DM Chest now feels normal on her routine meds. Still a little dry cough. Has had pneumovax more than once.   07/10/11- 86 yoF followed for COPD, Allergic rhinitis    Had flu vaccine. Used a Z-Pak while getting over a chest cold in early December but otherwise has done well with no acute respiratory illness. Today she feels "fine". Says she is doing well with allergy vaccine. Using a saline nasal gel which reduces epistaxis. Using Advair once daily and almost never needs her rescue inhaler.  01/08/12- 87 yoF followed for COPD, Allergic rhinitis  :"doing good" denies any wheezing, cough, congestion, or  SOB COPD assessment test (CAT) 6/40 She continues allergy vaccine without problems at 1:10. She expresses concern about exposure to mold or dust. 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 She denies any wheezing and admits only a little cough. CT chest 12/16/10 ( after pneumonia in June) reviewed. IMPRESSION:  No evidence of pulmonary embolism.  Patchy opacities in the right middle lobe, as well as scattered  mild opacities in the lingula and bilateral lower lobes, possibly  reflecting a very mild pneumonia or bronchiolitis.  Original Report Authenticated By: Charline Bills, M.D.   07/31/12- 89 yoF former smoker followed for COPD, Allergic rhinitis  FOLLOWS FOR: still on allergy vaccine 1:10 GH and no reactions, having sneezing episodes Viral bronchitis syndrome in January resolved. Occasional sneezing and blowing. She does have Astelin and Nasonex nasal sprays. She feels well enough controlled. We discussed chest CT from July, 2012 and agreed to update chest x-ray.  02/02/13- 88 yoF former smoker followed for COPD, Allergic rhinitis  FOLLOWS FOR: still on  Allergy vaccine 1:10 GH and doing well; has had dry cough for several months however. Dry cough since spring with throat tickle. Still using Advair one time daily. Little postnasal drip or reflux. CT 08/12/12 IMPRESSION:  1. No acute cardiopulmonary abnormalities.  2. Similar appearance of the bilateral upper lobe predominant  parenchymal scarring and pleural thickening with calcifications.  3. Stable 3 mm nodule in the left upper lobe. This is most likely  benign.  Original Report Authenticated By: Signa Kell, M.D.  ROS-see HPI Constitutional:   No-   weight loss, night sweats, fevers, chills, fatigue, lassitude. HEENT:   No-  headaches, difficulty swallowing, tooth/dental problems, sore throat,       No-  sneezing, itching, ear ache, nasal congestion, +post nasal drip,  CV:  No-   chest pain, orthopnea, PND, swelling in lower extremities, anasarca, dizziness, palpitations Resp: No-   shortness of breath with exertion or at rest.              No-   productive cough,  +non-productive  cough,  No- coughing up of blood.              No-   change in color of mucus.  No- wheezing.   Skin: No-   rash or lesions. GI:  No-   heartburn, indigestion, abdominal pain, nausea, vomiting,  GU:  MS:  No-   joint pain or swelling.   Neuro-     nothing unusual Psych:  No- change in mood or affect. No depression or anxiety.  No memory loss.  Objective:   Physical Exam General- Alert, Oriented,  Affect-appropriate, Distress- none acute,   thin Skin- rash-none, lesions- none, excoriation- none Lymphadenopathy- none Head- atraumatic            Eyes- Gross vision intact, PERRLA, strabismus, conjunctivae clear secretions            Ears- Hearing, canals normal            Nose- Clear, no-Septal dev, mucus, polyps, erosion, perforation             Throat- Mallampati II , mucosa clear , drainage- none, tonsils- atrophic Neck- flexible , trachea midline, no stridor , thyroid nl, carotid no bruit Chest - symmetrical excursion , unlabored           Heart/CV- RRR , no murmur , no gallop  , no rub, nl s1 s2                           - JVD- none , edema- none, stasis changes- none, varices- none           Lung- clear to P&A, + distant , wheeze- none, cough- none , dullness-none, rub- none           Chest wall-  Abd- Br/ Gen/ Rectal- Not done, not indicated Extrem- cyanosis- none, clubbing, none, atrophy- none, strength- nl Neuro- grossly intact to observation

## 2013-02-11 ENCOUNTER — Other Ambulatory Visit: Payer: Self-pay | Admitting: Family Medicine

## 2013-02-11 DIAGNOSIS — N63 Unspecified lump in unspecified breast: Secondary | ICD-10-CM

## 2013-02-14 NOTE — Assessment & Plan Note (Signed)
She believes allergy vaccine helps and is comfortable continuing.

## 2013-02-14 NOTE — Assessment & Plan Note (Signed)
Mild nonspecific cough. We talked about possibilities. Plan-try using Advair twice daily. Try Delsym cough syrup

## 2013-02-14 NOTE — Assessment & Plan Note (Signed)
Plan-continue allergy vaccine as discussed.

## 2013-03-02 ENCOUNTER — Ambulatory Visit
Admission: RE | Admit: 2013-03-02 | Discharge: 2013-03-02 | Disposition: A | Discharge status: Home / Self Care | Payer: Medicare Other | Source: Ambulatory Visit | Attending: Family Medicine | Admitting: Family Medicine

## 2013-03-02 ENCOUNTER — Other Ambulatory Visit: Payer: Self-pay | Admitting: Family Medicine

## 2013-03-02 DIAGNOSIS — N63 Unspecified lump in unspecified breast: Secondary | ICD-10-CM

## 2013-03-03 ENCOUNTER — Other Ambulatory Visit: Payer: Self-pay | Admitting: Family Medicine

## 2013-03-03 DIAGNOSIS — N63 Unspecified lump in unspecified breast: Secondary | ICD-10-CM

## 2013-03-05 ENCOUNTER — Ambulatory Visit
Admission: RE | Admit: 2013-03-05 | Discharge: 2013-03-05 | Disposition: A | Discharge status: Home / Self Care | Payer: Medicare Other | Source: Ambulatory Visit | Attending: Family Medicine | Admitting: Family Medicine

## 2013-03-05 DIAGNOSIS — N63 Unspecified lump in unspecified breast: Secondary | ICD-10-CM

## 2013-03-13 ENCOUNTER — Ambulatory Visit (INDEPENDENT_AMBULATORY_CARE_PROVIDER_SITE_OTHER): Payer: Medicare Other | Admitting: General Surgery

## 2013-03-13 ENCOUNTER — Encounter (INDEPENDENT_AMBULATORY_CARE_PROVIDER_SITE_OTHER): Payer: Self-pay | Admitting: General Surgery

## 2013-03-13 VITALS — BP 132/80 | HR 80 | Temp 98.9°F | Resp 14 | Ht 64.0 in | Wt 113.2 lb

## 2013-03-13 DIAGNOSIS — C50211 Malignant neoplasm of upper-inner quadrant of right female breast: Secondary | ICD-10-CM

## 2013-03-13 DIAGNOSIS — C50219 Malignant neoplasm of upper-inner quadrant of unspecified female breast: Secondary | ICD-10-CM | POA: Insufficient documentation

## 2013-03-13 NOTE — Patient Instructions (Addendum)
Mastectomy, With or Without Reconstruction Mastectomy (removal of the breast) is a procedure most commonly used to treat cancer (tumor) of the breast. Different procedures are available for treatment. This depends on the stage of the tumor (abnormal growths). Discuss this with your caregiver, surgeon (a specialist for performing operations such as this), or oncologist (someone specialized in the treatment of cancer). With proper information, you can decide which treatment is best for you. Although the sound of the word cancer is frightening to all of us, the new treatments and medications can be a source of reassurance and comfort. If there are things you are worried about, discuss them with your caregiver. He or she can help comfort you and your family. Some of the different procedures for treating breast cancer are:  Radical (extensive) mastectomy. This is an operation used to remove the entire breast, the muscles under the breast, and all of the glands (lymph nodes) under the arm. With all of the new treatments available for cancer of the breast, this procedure has become less common.  Modified radical mastectomy. This is a similar operation to the radical mastectomy described above. In the modified radical mastectomy, the muscles of the chest wall are not removed unless one of the lessor muscles is removed. One of the lessor muscles may be removed to allow better removal of the lymph nodes. The axillary lymph nodes are also removed. Rarely, during an axillary node dissection nerves to this area are damaged. Radiation therapy is then often used to the area following this surgery.  A total mastectomy also known as a complete or simple mastectomy. It involves removal of only the breast. The lymph nodes and the muscles are left in place.  In a lumpectomy, the lump is removed from the breast. This is the simplest form of surgical treatment. A sentinel lymph node biopsy may also be done. Additional treatment  may be required. RISKS AND COMPLICATIONS The main problems that follow removal of the breast include:  Infection (germs start growing in the wound). This can usually be treated with antibiotics (medications that kill germs).  Lymphedema. This means the arm on the side of the breast that was operated on swells because the lymph (tissue fluid) cannot follow the main channels back into the body. This only occurs when the lymph nodes have had to be removed under the arm.  There may be some areas of numbness to the upper arm and around the incision (cut by the surgeon) in the breast. This happens because of the cutting of or damage to some of the nerves in the area. This is most often unavoidable.  There may be difficulty moving the arm in a full range of motion (moving in all directions) following surgery. This usually improves with time following use and exercise.  Recurrence of breast cancer may happen with the very best of surgery and follow up treatment. Sometimes small cancer cells that cannot be seen with the naked eye have already spread at the time of surgery. When this happens other treatment is available. This treatment may be radiation, medications or a combination of both. RECONSTRUCTION Reconstruction of the breast may be done immediately if there is not going to be post-operative radiation. This surgery is done for cosmetic (improve appearance) purposes to improve the physical appearance after the operation. This may be done in two ways:  It can be done using a saline filled prosthetic (an artificial breast which is filled with salt water). Silicone breast implants are now   re-approved by the FDA and are being commonly used.  Reconstruction can be done using the body's own muscle/fat/skin. Your caregiver will discuss your options with you. Depending upon your needs or choice, together you will be able to determine which procedure is best for you. Document Released: 02/20/2001 Document  Revised: 02/20/2012 Document Reviewed: 10/14/2007 [employer] Patient Information 2014 [employer]  

## 2013-03-13 NOTE — Progress Notes (Signed)
Chief Complaint: New diagnosis of breast cancer  History:    Rhonda Bryant is a 79 y.o. postmenopausal female referred by Dr. Drew Davis  for evaluation of recently diagnosed carcinoma of the right breast.  The patient has a personal history of invasive cancer of the left breast in 1989 treated with modified mastectomy and hormonal treatment. She had a tissue expander and implant reconstruction on the left side at that time and had a subglandular implant placed on the right side for symmetry. The patient states that for approximately 4-6 months she has felt a lump in the upper inner right breast. She recently presented for a screening mamogram revealing A probable mass in the right breast..  Subsequent imaging included diagnostic mamogram showing A suspicious mass as well as a rupture in the superior portion of her implant and ultrasound showing A 2.3 cm hypoechoic lobular mass 4 cm from the nipple at the 2:00 position..   A ultrasound guided biopsy was performed on 03/05/2013 with pathology revealing infiltrating ductal carcinoma of the breast. She is seen now in [facility] office for initial treatment planning.  She has experienced A breast lump as above for about 4 months but no skin changes or nipple discharge or pain..  She does have a personal history of Contralateral breast cancer as above in 1989..  Findings at that time were the following:  Tumor size: 2.8 cm  Tumor grade: 2  Estrogen Receptor: positive Progesterone Receptor: negative  Her-2 neu: negative  Lymph node status: negative Neurovascular invasion: no Lymphatic invasion: no  Past Medical History  Diagnosis Date  . Chronic airway obstruction, not elsewhere classified   . Allergic rhinitis, cause unspecified   . Unspecified essential hypertension   . Osteoporosis   . Hypertension   . Arthritis   . Blood transfusion without reported diagnosis   . Cancer   . Heart murmur     Past Surgical History  Procedure Laterality Date   . Mastectomy      left  . Cataract extraction    . Tonsillectomy      Current Outpatient Prescriptions  Medication Sig Dispense Refill  . acetaminophen (TYLENOL) 500 MG tablet Take 500 mg by mouth every 6 (six) hours as needed.        . albuterol (PROVENTIL HFA) 108 (90 BASE) MCG/ACT inhaler Inhale 2 puffs into the lungs every 6 (six) hours as needed for wheezing or shortness of breath (rescue inhaler).  1 Inhaler  0  . Aloe-Sodium Chloride (AYR SALINE NASAL GEL NA) Place into the nose.      . ALPRAZolam (XANAX) 0.25 MG tablet Take 0.25 mg by mouth at bedtime as needed.        . amLODipine (NORVASC) 10 MG tablet Take 5 mg by mouth daily.      . Calcium-Vitamin D 600-125 MG-UNIT TABS Take 1 tablet by mouth 2 (two) times daily.        . Fluticasone-Salmeterol (ADVAIR DISKUS) 100-50 MCG/DOSE AEPB Inhale 1 puff into the lungs 2 (two) times daily. Rinse mouth  60 each  11  . loratadine (CLARITIN) 10 MG tablet Take 10 mg by mouth daily.      . mometasone (NASONEX) 50 MCG/ACT nasal spray Place 2 sprays into the nose daily.  17 g  11  . Multiple Vitamin (MULTIVITAMIN) capsule Take 1 capsule by mouth daily.        . MYRBETRIQ 25 MG TB24 Take 1 tablet by mouth daily.         No current facility-administered medications for this visit.    Family History  Problem Relation Age of Onset  . Pancreatic cancer Father   . Cancer Father     pancreatic  . Breast cancer Mother   . Cancer Mother     breast    History   Social History  . Marital Status: Married    Spouse Name: N/A    Number of Children: N/A  . Years of Education: N/A   Social History Main Topics  . Smoking status: Former Smoker  . Smokeless tobacco: Never Used  . Alcohol Use: Yes     Comment: rarely  . Drug Use: No  . Sexual Activity: None   Other Topics Concern  . None   Social History Narrative  . None     Review of Systems Constitutional: negative Respiratory: positive for asthma and chronic  bronchitis Cardiovascular: negative Gastrointestinal: negative     Objective:  BP 132/80  Pulse 80  Temp(Src) 98.9 F (37.2 C) (Temporal)  Resp 14  Ht 5' 4" (1.626 m)  Wt 113 lb 3.2 oz (51.347 kg)  BMI 19.42 kg/m2  General: Thin alert Caucasian female appearing younger than her stated age, in no distress Skin: Warm and dry without rash or infection. HEENT: No palpable masses or thyromegaly. Sclera nonicteric. Pupils equal round and reactive. Oropharynx clear. Breasts: Right breast with implant. In the upper inner quadrant is an area of bruising from the biopsy as well as an approximately 3 x 0.5 cm smooth freely movable palpable mass. No other masses in this breast. He'll reconstruction on the left with no skin change or masses. No axillary adenopathy palpable on either side. Lymph nodes: No cervical, supraclavicular, or inguinal nodes palpable. Lungs: Breath sounds clear and equal without increased work of breathing Cardiovascular: Regular rate and rhythm without murmur. No JVD or edema. Peripheral pulses intact. Abdomen: Nondistended. Soft and nontender. No masses palpable. No organomegaly. No palpable hernias. Extremities: No edema or joint swelling or deformity. No chronic venous stasis changes. Neurologic: Alert and fully oriented. Gait normal.   Laboratory data:  CBC:  Lab Results  Component Value Date   WBC 13.8* 12/16/2010   RBC 4.11 12/16/2010   HGB 12.7 12/16/2010   HCT 36.5 12/16/2010   PLT 287 12/16/2010  ]  [facility] Labs:  Lab Results  Component Value Date   NA 138 08/19/2012   K 3.7 08/19/2012   CL 102 08/19/2012   CO2 28 08/19/2012   BUN 14 08/19/2012   CREATININE 0.7 08/19/2012   CALCIUM 8.7 08/19/2012     Assessment  79 y.o. female with a new diagnosis of cancer of the the right breast upper inner quadrant.  Clinical IB, estrogen receptor positive. I discussed with the patient and family members present today initial surgical treatment options. We discussed options of  breast conservation with lumpectomy or total mastectomy and sentinal lymph node biopsy/dissection. Options for reconstruction were discussed. She has a ruptured implant on the right and very thin breast tissue over this at the site of the tumor. A lumpectomy would be somewhat difficult due to the implant. She may need her implant removed regardless of the type of surgery. After discussion they have elected to proceed with right total mastectomy. She would like to avoid radiation if possible. We discussed the indications and nature of the procedure, and expected recovery, in detail. Surgical risks including anesthetic complications, cardiorespiratory complications, bleeding, infection, wound healing complications, blood clots, lymphedema, local and distant   recurrence and possible need for further surgery based on the final pathology was discussed and understood. We are going to get a plastic surgeon evaluation prior to surgical therapy to advise on possible removal of her implant which I think would be absolutely necessary mastectomy which is what she would prefer and the possibility of reconstruction if she would like. Chemotherapy, hormonal therapy and radiation therapy have been discussed. They have been provided with literature regarding the treatment of breast cancer.  All questions were answered. I will call the patient to confirm the plan at scheduling after plastic surgery evaluation..  Plan right total mastectomy and sentinel lymph node biopsy and likely removal of her implant =/- reconstruction pending her evaluation.  Yalissa Fink T Ayvion Kavanagh MD, FACS  03/13/2013, 3:41 PM 

## 2013-03-16 ENCOUNTER — Telehealth (INDEPENDENT_AMBULATORY_CARE_PROVIDER_SITE_OTHER): Payer: Self-pay | Admitting: *Deleted

## 2013-03-16 NOTE — Telephone Encounter (Signed)
Called pt to give her appt information to see Dr. Odis Luster located at [address] on 03/20/13 at 8:30 am.  Unable to leave message, will attempt to call pt back later.

## 2013-03-25 ENCOUNTER — Other Ambulatory Visit (INDEPENDENT_AMBULATORY_CARE_PROVIDER_SITE_OTHER): Payer: Self-pay | Admitting: General Surgery

## 2013-03-25 ENCOUNTER — Telehealth (INDEPENDENT_AMBULATORY_CARE_PROVIDER_SITE_OTHER): Payer: Self-pay

## 2013-03-25 DIAGNOSIS — C50911 Malignant neoplasm of unspecified site of right female breast: Secondary | ICD-10-CM

## 2013-03-25 NOTE — Telephone Encounter (Signed)
Patient called to check on scheduling surgery.  She saw Dr Odis Luster and has decided against reconstruction.  She is going to have her implant removed though.  Lupita Leash from Dr Odis Luster office will call to help coordinate.  The pt states she is allergic to Ragweed and Dust.  She gets an allergy shot for it.  She wonders if she needs to stop it preop.    Please call when you know about scheduling.

## 2013-03-27 ENCOUNTER — Ambulatory Visit (INDEPENDENT_AMBULATORY_CARE_PROVIDER_SITE_OTHER): Payer: Medicare Other

## 2013-03-27 DIAGNOSIS — Z23 Encounter for immunization: Secondary | ICD-10-CM

## 2013-03-30 ENCOUNTER — Other Ambulatory Visit: Payer: Self-pay | Admitting: Plastic Surgery

## 2013-03-31 ENCOUNTER — Other Ambulatory Visit (HOSPITAL_COMMUNITY): Payer: Medicare Other

## 2013-04-01 ENCOUNTER — Encounter (HOSPITAL_COMMUNITY): Payer: Self-pay | Admitting: Pharmacy Technician

## 2013-04-02 ENCOUNTER — Encounter (HOSPITAL_COMMUNITY)
Admission: RE | Admit: 2013-04-02 | Discharge: 2013-04-02 | Disposition: A | Discharge status: Home / Self Care | Payer: Medicare Other | Source: Ambulatory Visit | Attending: General Surgery | Admitting: General Surgery

## 2013-04-02 ENCOUNTER — Encounter (HOSPITAL_COMMUNITY): Payer: Self-pay

## 2013-04-02 HISTORY — DX: Pneumonia, unspecified organism: J18.9

## 2013-04-02 HISTORY — DX: Other specified postprocedural states: Z98.890

## 2013-04-02 HISTORY — DX: Shortness of breath: R06.02

## 2013-04-02 HISTORY — DX: Depression, unspecified: F32.A

## 2013-04-02 HISTORY — DX: Major depressive disorder, single episode, unspecified: F32.9

## 2013-04-02 HISTORY — DX: Anxiety disorder, unspecified: F41.9

## 2013-04-02 HISTORY — DX: Nausea with vomiting, unspecified: Z98.890

## 2013-04-02 HISTORY — DX: Other specified postprocedural states: R11.2

## 2013-04-02 LAB — BASIC METABOLIC PANEL
BUN: 16 mg/dL (ref 6–23)
Calcium: 9.7 mg/dL (ref 8.4–10.5)
Chloride: 100 mEq/L (ref 96–112)
GFR calc non Af Amer: 73 mL/min — ABNORMAL LOW (ref >90)
Glucose, Bld: 103 mg/dL — ABNORMAL HIGH (ref 70–99)
Potassium: 3.7 mEq/L (ref 3.5–5.1)

## 2013-04-02 LAB — CBC
HCT: 41.6 % (ref 36.0–46.0)
Hemoglobin: 13.8 g/dL (ref 12.0–15.0)
MCH: 30.5 pg (ref 26.0–34.0)
MCHC: 33.2 g/dL (ref 30.0–36.0)

## 2013-04-02 MED ORDER — CEFAZOLIN SODIUM-DEXTROSE 2-3 GM-% IV SOLR
2.0000 g | INTRAVENOUS | Status: AC
  Administered 2013-04-03: 2 g via INTRAVENOUS
  Filled 2013-04-02: qty 50

## 2013-04-02 NOTE — Pre-Procedure Instructions (Signed)
Rhonda Bryant  04/02/2013   Your procedure is scheduled on: Oct 24 @0730   Report to [facility]  2 * 3 at 0530 AM.  Call this number if you have problems the morning of surgery: [phone]   Remember:   Do not eat food or drink liquids after midnight.   Take these medicines the morning of surgery with A SIP OF WATER:Inhalers if needed, xanax (alprazolam) if needed, Norvasc (amloipine), Claritin ( loratadine), and Myrbetriq   Stop taking Aspirin, Aleve, Ibuprofen, BC's, Goody's, Fish Oil and Herbal medications   Do not wear jewelry, make-up or nail polish.  Do not wear lotions, powders, or perfumes. You may wear deodorant.  Do not shave 48 hours prior to surgery. Men may shave face and neck.  Do not bring valuables to the hospital.  [facility] is not responsible                  for any belongings or valuables.               Contacts, dentures or bridgework may not be worn into surgery.  Leave suitcase in the car. After surgery it may be brought to your room.  For patients admitted to the hospital, discharge time is determined by your                treatment team.               Patients discharged the day of surgery will not be allowed to drive  home.    Special Instructions: Shower using CHG 2 nights before surgery and the night before surgery.  If you shower the day of surgery use CHG.  Use special wash - you have one bottle of CHG for all showers.  You should use approximately 1/3 of the bottle for each shower.   Please read over the following fact sheets that you were given: Pain Booklet, Coughing and Deep Breathing and Surgical Site Infection Prevention

## 2013-04-02 NOTE — Progress Notes (Signed)
PCP is Dr Lupita Raider Denies seeing a cardiologist Denies having a stress test or card cath.  Rhonda Bryant is she has had an echo, but if she did it was years ago. CXR noted form 07-31-12 in epic  Voices understanding of pre-admit instructions.

## 2013-04-03 ENCOUNTER — Encounter (HOSPITAL_COMMUNITY): Payer: Medicare Other | Admitting: Anesthesiology

## 2013-04-03 ENCOUNTER — Encounter (HOSPITAL_COMMUNITY)
Admission: RE | Disposition: A | Discharge status: Home / Self Care | Payer: Self-pay | Source: Ambulatory Visit | Attending: General Surgery

## 2013-04-03 ENCOUNTER — Ambulatory Visit (HOSPITAL_COMMUNITY): Payer: Medicare Other | Admitting: Anesthesiology

## 2013-04-03 ENCOUNTER — Encounter (HOSPITAL_COMMUNITY): Payer: Self-pay | Admitting: *Deleted

## 2013-04-03 ENCOUNTER — Ambulatory Visit (HOSPITAL_COMMUNITY)
Admission: RE | Admit: 2013-04-03 | Discharge: 2013-04-03 | Disposition: A | Discharge status: Home / Self Care | Payer: Medicare Other | Source: Ambulatory Visit | Attending: General Surgery | Admitting: General Surgery

## 2013-04-03 ENCOUNTER — Observation Stay (HOSPITAL_COMMUNITY)
Admission: RE | Admit: 2013-04-03 | Discharge: 2013-04-04 | Disposition: A | Discharge status: Home / Self Care | Payer: Medicare Other | Source: Ambulatory Visit | Attending: General Surgery | Admitting: General Surgery

## 2013-04-03 DIAGNOSIS — C50219 Malignant neoplasm of upper-inner quadrant of unspecified female breast: Principal | ICD-10-CM | POA: Insufficient documentation

## 2013-04-03 DIAGNOSIS — I10 Essential (primary) hypertension: Secondary | ICD-10-CM | POA: Insufficient documentation

## 2013-04-03 DIAGNOSIS — T8549XA Other mechanical complication of breast prosthesis and implant, initial encounter: Secondary | ICD-10-CM | POA: Insufficient documentation

## 2013-04-03 DIAGNOSIS — Z01810 Encounter for preprocedural cardiovascular examination: Secondary | ICD-10-CM | POA: Insufficient documentation

## 2013-04-03 DIAGNOSIS — Y831 Surgical operation with implant of artificial internal device as the cause of abnormal reaction of the patient, or of later complication, without mention of misadventure at the time of the procedure: Secondary | ICD-10-CM | POA: Insufficient documentation

## 2013-04-03 DIAGNOSIS — C50419 Malignant neoplasm of upper-outer quadrant of unspecified female breast: Secondary | ICD-10-CM | POA: Insufficient documentation

## 2013-04-03 DIAGNOSIS — C50911 Malignant neoplasm of unspecified site of right female breast: Secondary | ICD-10-CM

## 2013-04-03 DIAGNOSIS — J4489 Other specified chronic obstructive pulmonary disease: Secondary | ICD-10-CM | POA: Insufficient documentation

## 2013-04-03 DIAGNOSIS — Z853 Personal history of malignant neoplasm of breast: Secondary | ICD-10-CM | POA: Insufficient documentation

## 2013-04-03 DIAGNOSIS — D0590 Unspecified type of carcinoma in situ of unspecified breast: Secondary | ICD-10-CM

## 2013-04-03 DIAGNOSIS — Z170 Estrogen receptor positive status [ER+]: Secondary | ICD-10-CM | POA: Insufficient documentation

## 2013-04-03 DIAGNOSIS — J449 Chronic obstructive pulmonary disease, unspecified: Secondary | ICD-10-CM | POA: Insufficient documentation

## 2013-04-03 DIAGNOSIS — Z79899 Other long term (current) drug therapy: Secondary | ICD-10-CM | POA: Insufficient documentation

## 2013-04-03 DIAGNOSIS — Z01812 Encounter for preprocedural laboratory examination: Secondary | ICD-10-CM | POA: Insufficient documentation

## 2013-04-03 HISTORY — PX: CAPSULECTOMY: SHX5381

## 2013-04-03 HISTORY — PX: SIMPLE MASTECTOMY WITH AXILLARY SENTINEL NODE BIOPSY: SHX6098

## 2013-04-03 SURGERY — SIMPLE MASTECTOMY WITH AXILLARY SENTINEL NODE BIOPSY
Anesthesia: General | Site: Breast | Laterality: Right | Wound class: Clean

## 2013-04-03 MED ORDER — HYDROCODONE-ACETAMINOPHEN 5-325 MG PO TABS
1.0000 | ORAL_TABLET | ORAL | Status: DC | PRN
  Filled 2013-04-03: qty 1

## 2013-04-03 MED ORDER — GLYCOPYRROLATE 0.2 MG/ML IJ SOLN
INTRAMUSCULAR | Status: DC | PRN
  Administered 2013-04-03: .5 mg via INTRAVENOUS

## 2013-04-03 MED ORDER — OXYCODONE HCL 5 MG PO TABS: 5.0000 mg | ORAL_TABLET | Freq: Once | ORAL | Status: DC | PRN

## 2013-04-03 MED ORDER — HEPARIN SODIUM (PORCINE) 5000 UNIT/ML IJ SOLN
5000.0000 [IU] | Freq: Three times a day (TID) | INTRAMUSCULAR | Status: DC
  Administered 2013-04-03 – 2013-04-04 (×2): 5000 [IU] via SUBCUTANEOUS
  Filled 2013-04-03 (×4): qty 1

## 2013-04-03 MED ORDER — AMLODIPINE BESYLATE 5 MG PO TABS
5.0000 mg | ORAL_TABLET | Freq: Every day | ORAL | Status: DC
  Administered 2013-04-04: 5 mg via ORAL
  Filled 2013-04-03: qty 1

## 2013-04-03 MED ORDER — ALBUTEROL SULFATE HFA 108 (90 BASE) MCG/ACT IN AERS
2.0000 | INHALATION_SPRAY | Freq: Four times a day (QID) | RESPIRATORY_TRACT | Status: DC | PRN
  Filled 2013-04-03: qty 6.7

## 2013-04-03 MED ORDER — SODIUM CHLORIDE 0.9 % IR SOLN
Status: DC | PRN
  Administered 2013-04-03: 08:00:00

## 2013-04-03 MED ORDER — HYDROMORPHONE HCL PF 1 MG/ML IJ SOLN
0.2500 mg | INTRAMUSCULAR | Status: DC | PRN
  Administered 2013-04-03 (×2): 0.25 mg via INTRAVENOUS

## 2013-04-03 MED ORDER — ROCURONIUM BROMIDE 100 MG/10ML IV SOLN
INTRAVENOUS | Status: DC | PRN
  Administered 2013-04-03: 40 mg via INTRAVENOUS

## 2013-04-03 MED ORDER — ALPRAZOLAM 0.25 MG PO TABS
0.2500 mg | ORAL_TABLET | Freq: Two times a day (BID) | ORAL | Status: DC | PRN
  Administered 2013-04-03 – 2013-04-04 (×2): 0.25 mg via ORAL
  Filled 2013-04-03 (×2): qty 1

## 2013-04-03 MED ORDER — MOMETASONE FURO-FORMOTEROL FUM 100-5 MCG/ACT IN AERO
2.0000 | INHALATION_SPRAY | Freq: Two times a day (BID) | RESPIRATORY_TRACT | Status: DC
  Administered 2013-04-03 – 2013-04-04 (×3): 2 via RESPIRATORY_TRACT
  Filled 2013-04-03: qty 8.8

## 2013-04-03 MED ORDER — DEXAMETHASONE SODIUM PHOSPHATE 10 MG/ML IJ SOLN
INTRAMUSCULAR | Status: DC | PRN
  Administered 2013-04-03: 4 mg via INTRAVENOUS

## 2013-04-03 MED ORDER — LORATADINE 10 MG PO TABS
10.0000 mg | ORAL_TABLET | Freq: Every day | ORAL | Status: DC
  Administered 2013-04-04: 10 mg via ORAL
  Filled 2013-04-03: qty 1

## 2013-04-03 MED ORDER — FLUTICASONE PROPIONATE 50 MCG/ACT NA SUSP
2.0000 | Freq: Every day | NASAL | Status: DC
  Administered 2013-04-03: 2 via NASAL
  Filled 2013-04-03: qty 16

## 2013-04-03 MED ORDER — ONDANSETRON HCL 4 MG/2ML IJ SOLN
INTRAMUSCULAR | Status: AC
  Filled 2013-04-03: qty 2

## 2013-04-03 MED ORDER — SODIUM CHLORIDE 0.9 % IJ SOLN
INTRAMUSCULAR | Status: DC | PRN
  Administered 2013-04-03 (×2): via INTRADERMAL

## 2013-04-03 MED ORDER — ONDANSETRON HCL 4 MG PO TABS: 4.0000 mg | ORAL_TABLET | Freq: Four times a day (QID) | ORAL | Status: DC | PRN

## 2013-04-03 MED ORDER — MEPERIDINE HCL 25 MG/ML IJ SOLN: 6.2500 mg | INTRAMUSCULAR | Status: DC | PRN

## 2013-04-03 MED ORDER — FENTANYL CITRATE 0.05 MG/ML IJ SOLN
INTRAMUSCULAR | Status: DC | PRN
  Administered 2013-04-03: 100 ug via INTRAVENOUS
  Administered 2013-04-03: 50 ug via INTRAVENOUS

## 2013-04-03 MED ORDER — CALCIUM CARBONATE ANTACID 750 MG PO CHEW: 1.0000 | CHEWABLE_TABLET | Freq: Two times a day (BID) | ORAL | Status: DC | PRN

## 2013-04-03 MED ORDER — LIDOCAINE HCL (CARDIAC) 20 MG/ML IV SOLN
INTRAVENOUS | Status: DC | PRN
  Administered 2013-04-03: 100 mg via INTRAVENOUS

## 2013-04-03 MED ORDER — PHENYLEPHRINE HCL 10 MG/ML IJ SOLN
INTRAMUSCULAR | Status: DC | PRN
  Administered 2013-04-03 (×3): 40 ug via INTRAVENOUS

## 2013-04-03 MED ORDER — NEOSTIGMINE METHYLSULFATE 1 MG/ML IJ SOLN
INTRAMUSCULAR | Status: DC | PRN
  Administered 2013-04-03: 3 mg via INTRAVENOUS

## 2013-04-03 MED ORDER — MORPHINE SULFATE 2 MG/ML IJ SOLN: 1.0000 mg | INTRAMUSCULAR | Status: DC | PRN

## 2013-04-03 MED ORDER — CALCIUM CARBONATE ANTACID 500 MG PO CHEW
1.0000 | CHEWABLE_TABLET | Freq: Two times a day (BID) | ORAL | Status: DC | PRN
  Administered 2013-04-03: 200 mg via ORAL
  Filled 2013-04-03: qty 1

## 2013-04-03 MED ORDER — PROPOFOL 10 MG/ML IV BOLUS
INTRAVENOUS | Status: DC | PRN
  Administered 2013-04-03: 100 mg via INTRAVENOUS

## 2013-04-03 MED ORDER — ONDANSETRON HCL 4 MG/2ML IJ SOLN
INTRAMUSCULAR | Status: DC | PRN
  Administered 2013-04-03: 4 mg via INTRAVENOUS

## 2013-04-03 MED ORDER — HEPARIN SODIUM (PORCINE) 5000 UNIT/ML IJ SOLN
5000.0000 [IU] | Freq: Once | INTRAMUSCULAR | Status: AC
  Administered 2013-04-03: 5000 [IU] via SUBCUTANEOUS
  Filled 2013-04-03: qty 1

## 2013-04-03 MED ORDER — 0.9 % SODIUM CHLORIDE (POUR BTL) OPTIME
TOPICAL | Status: DC | PRN
  Administered 2013-04-03: 1000 mL

## 2013-04-03 MED ORDER — MIRABEGRON ER 25 MG PO TB24
25.0000 mg | ORAL_TABLET | Freq: Every day | ORAL | Status: DC
  Administered 2013-04-04: 25 mg via ORAL
  Filled 2013-04-03: qty 1

## 2013-04-03 MED ORDER — OXYCODONE HCL 5 MG/5ML PO SOLN: 5.0000 mg | Freq: Once | ORAL | Status: DC | PRN

## 2013-04-03 MED ORDER — LACTATED RINGERS IV SOLN
INTRAVENOUS | Status: DC | PRN
  Administered 2013-04-03 (×2): via INTRAVENOUS

## 2013-04-03 MED ORDER — ONDANSETRON HCL 4 MG/2ML IJ SOLN
4.0000 mg | Freq: Four times a day (QID) | INTRAMUSCULAR | Status: DC | PRN
  Administered 2013-04-03: 4 mg via INTRAVENOUS

## 2013-04-03 MED ORDER — HYDROMORPHONE HCL PF 1 MG/ML IJ SOLN
INTRAMUSCULAR | Status: AC
  Filled 2013-04-03: qty 1

## 2013-04-03 MED ORDER — CHLORHEXIDINE GLUCONATE 4 % EX LIQD: 1.0000 "application " | Freq: Once | CUTANEOUS | Status: DC

## 2013-04-03 MED ORDER — MIDAZOLAM HCL 5 MG/5ML IJ SOLN
INTRAMUSCULAR | Status: DC | PRN
  Administered 2013-04-03 (×2): 1 mg via INTRAVENOUS

## 2013-04-03 MED ORDER — METHYLENE BLUE 1 % INJ SOLN
INTRAMUSCULAR | Status: AC
  Filled 2013-04-03: qty 10

## 2013-04-03 MED ORDER — TECHNETIUM TC 99M SULFUR COLLOID FILTERED
1.0000 | Freq: Once | INTRAVENOUS | Status: AC | PRN
  Administered 2013-04-03: 1 via INTRADERMAL

## 2013-04-03 MED ORDER — ONDANSETRON HCL 4 MG/2ML IJ SOLN: 4.0000 mg | Freq: Once | INTRAMUSCULAR | Status: DC | PRN

## 2013-04-03 MED ORDER — KCL IN DEXTROSE-NACL 20-5-0.9 MEQ/L-%-% IV SOLN
INTRAVENOUS | Status: DC
  Administered 2013-04-03: 14:00:00 via INTRAVENOUS
  Filled 2013-04-03 (×2): qty 1000

## 2013-04-03 SURGICAL SUPPLY — 65 items
ADH SKN CLS APL DERMABOND .7 (GAUZE/BANDAGES/DRESSINGS) ×2
APPLIER CLIP 9.375 MED OPEN (MISCELLANEOUS) ×3
APR CLP MED 9.3 20 MLT OPN (MISCELLANEOUS) ×2
ATCH SMKEVC FLXB CAUT HNDSWH (FILTER) ×2 IMPLANT
BINDER BREAST LRG (GAUZE/BANDAGES/DRESSINGS) IMPLANT
BINDER BREAST MEDIUM (GAUZE/BANDAGES/DRESSINGS) ×1 IMPLANT
BINDER BREAST XLRG (GAUZE/BANDAGES/DRESSINGS) IMPLANT
BIOPATCH RED 1 DISK 7.0 (GAUZE/BANDAGES/DRESSINGS) ×5 IMPLANT
BLADE SURG 10 STRL SS (BLADE) ×3 IMPLANT
CANISTER SUCTION 2500CC (MISCELLANEOUS) ×6 IMPLANT
CHLORAPREP W/TINT 26ML (MISCELLANEOUS) ×6 IMPLANT
CLIP APPLIE 9.375 MED OPEN (MISCELLANEOUS) IMPLANT
CLIP TI MEDIUM 6 (CLIP) ×3 IMPLANT
CLOTH BEACON ORANGE TIMEOUT ST (SAFETY) ×3 IMPLANT
CONT SPEC 4OZ CLIKSEAL STRL BL (MISCELLANEOUS) ×5 IMPLANT
COVER PROBE W GEL 5X96 (DRAPES) ×3 IMPLANT
COVER SURGICAL LIGHT HANDLE (MISCELLANEOUS) ×6 IMPLANT
DERMABOND ADVANCED (GAUZE/BANDAGES/DRESSINGS) ×1
DERMABOND ADVANCED .7 DNX12 (GAUZE/BANDAGES/DRESSINGS) ×2 IMPLANT
DRAIN CHANNEL 19F RND (DRAIN) ×4 IMPLANT
DRAPE CHEST BREAST 15X10 FENES (DRAPES) ×3 IMPLANT
DRAPE LAPAROTOMY T 102X78X121 (DRAPES) ×3 IMPLANT
DRAPE UTILITY 15X26 W/TAPE STR (DRAPE) ×6 IMPLANT
DRSG PAD ABDOMINAL 8X10 ST (GAUZE/BANDAGES/DRESSINGS) ×5 IMPLANT
DRSG TEGADERM 4X4.75 (GAUZE/BANDAGES/DRESSINGS) ×2 IMPLANT
ELECT CAUTERY BLADE 6.4 (BLADE) ×3 IMPLANT
ELECT REM PT RETURN 9FT ADLT (ELECTROSURGICAL) ×6
ELECTRODE REM PT RTRN 9FT ADLT (ELECTROSURGICAL) ×4 IMPLANT
EVACUATOR SILICONE 100CC (DRAIN) ×4 IMPLANT
EVACUATOR SMOKE ACCUVAC VALLEY (FILTER) ×2
GLOVE BIO SURGEON STRL SZ7.5 (GLOVE) ×3 IMPLANT
GLOVE BIOGEL PI IND STRL 7.0 (GLOVE) IMPLANT
GLOVE BIOGEL PI IND STRL 8 (GLOVE) ×4 IMPLANT
GLOVE BIOGEL PI INDICATOR 7.0 (GLOVE) ×1
GLOVE BIOGEL PI INDICATOR 8 (GLOVE) ×2
GLOVE SS BIOGEL STRL SZ 7.5 (GLOVE) ×2 IMPLANT
GLOVE SUPERSENSE BIOGEL SZ 7.5 (GLOVE) ×1
GLOVE SURG SS PI 7.0 STRL IVOR (GLOVE) ×1 IMPLANT
GOWN STRL NON-REIN LRG LVL3 (GOWN DISPOSABLE) ×6 IMPLANT
GOWN STRL REIN XL XLG (GOWN DISPOSABLE) ×6 IMPLANT
KIT BASIN OR (CUSTOM PROCEDURE TRAY) ×6 IMPLANT
KIT ROOM TURNOVER OR (KITS) ×6 IMPLANT
MARKER SKIN DUAL TIP RULER LAB (MISCELLANEOUS) ×3 IMPLANT
NDL 18GX1X1/2 (RX/OR ONLY) (NEEDLE) ×2 IMPLANT
NDL HYPO 25GX1X1/2 BEV (NEEDLE) ×2 IMPLANT
NEEDLE 18GX1X1/2 (RX/OR ONLY) (NEEDLE) ×3 IMPLANT
NEEDLE HYPO 25GX1X1/2 BEV (NEEDLE) ×3 IMPLANT
NS IRRIG 1000ML POUR BTL (IV SOLUTION) ×6 IMPLANT
PACK GENERAL/GYN (CUSTOM PROCEDURE TRAY) ×6 IMPLANT
PAD ARMBOARD 7.5X6 YLW CONV (MISCELLANEOUS) ×9 IMPLANT
PREFILTER EVAC NS 1 1/3-3/8IN (MISCELLANEOUS) IMPLANT
SPECIMEN JAR X LARGE (MISCELLANEOUS) ×3 IMPLANT
SPONGE GAUZE 4X4 12PLY (GAUZE/BANDAGES/DRESSINGS) ×6 IMPLANT
STAPLER VISISTAT 35W (STAPLE) ×3 IMPLANT
SUT ETHILON 2 0 FS 18 (SUTURE) ×4 IMPLANT
SUT MNCRL AB 3-0 PS2 18 (SUTURE) ×6 IMPLANT
SUT MNCRL AB 4-0 PS2 18 (SUTURE) ×1 IMPLANT
SUT MON AB 5-0 PS2 18 (SUTURE) ×3 IMPLANT
SUT VIC AB 3-0 SH 18 (SUTURE) ×6 IMPLANT
SWAB COLLECTION DEVICE MRSA (MISCELLANEOUS) IMPLANT
SYR CONTROL 10ML LL (SYRINGE) ×4 IMPLANT
TOWEL OR 17X24 6PK STRL BLUE (TOWEL DISPOSABLE) ×6 IMPLANT
TOWEL OR 17X26 10 PK STRL BLUE (TOWEL DISPOSABLE) ×6 IMPLANT
TUBE ANAEROBIC SPECIMEN COL (MISCELLANEOUS) IMPLANT
TUBE CONNECTING 12X1/4 (SUCTIONS) ×1 IMPLANT

## 2013-04-03 NOTE — Progress Notes (Signed)
Nuc med called and informed pt is here and we will call when anesthesia gets ready to do procedure.

## 2013-04-03 NOTE — Anesthesia Postprocedure Evaluation (Deleted)
  Anesthesia Post-op Note  Patient: Rhonda Bryant  Procedure(s) Performed: Procedure(s): TOTAL MASTECTOMY WITH AXILLARY SENTINEL NODE BIOPSY (Right) TOTAL CAPSULECTOMY/REMOVAL OF IMPLANT MATERIAL RIGHT BREAST (Right)  Patient Location: PACU  Anesthesia Type:General  Level of Consciousness: awake, alert  and oriented  Airway and Oxygen Therapy: Patient Spontanous Breathing and Patient connected to nasal cannula oxygen  Post-op Pain: none  Post-op Assessment: Post-op Vital signs reviewed and Patient's Cardiovascular Status Stable  Post-op Vital Signs: Reviewed and stable  Complications: No apparent anesthesia complications

## 2013-04-03 NOTE — Preoperative (Signed)
Beta Blockers   Reason not to administer Beta Blockers:Not Applicable 

## 2013-04-03 NOTE — Interval H&P Note (Signed)
History and Physical Interval Note:  04/03/2013 9:45 AM  Rhonda Bryant  has presented today for surgery, with the diagnosis of right breast cancer  The various methods of treatment have been discussed with the patient and family. After consideration of risks, benefits and other options for treatment, the patient has consented to  Procedure(s): TOTAL MASTECTOMY WITH AXILLARY SENTINEL NODE BIOPSY (Right) TOTAL CAPSULECTOMY/REMOVAL OF IMPLANT MATERIAL RIGHT BREAST (Right) as a surgical intervention .  The patient's history has been reviewed, patient examined, no change in status, stable for surgery.  I have reviewed the patient's chart and labs.  Questions were answered to the patient's satisfaction.     Paulene Tayag T

## 2013-04-03 NOTE — Op Note (Deleted)
NAME:  Rhonda Bryant, Rhonda Bryant             ACCOUNT NO.:  [id number]  MEDICAL RECORD NO.:  [id number]  LOCATION:                               FACILITY:  [facility]  PHYSICIAN:  Cyndal Kasson, M.D.     DATE OF BIRTH:  12/06/1924  DATE OF PROCEDURE:  04/03/2013 DATE OF DISCHARGE:  04/04/2013                              OPERATIVE REPORT   PREOPERATIVE DIAGNOSIS:  Breast cancer.  POSTOPERATIVE DIAGNOSIS:  Breast cancer.  PROCEDURE PERFORMED:  Right total capsulectomy and removal of right chest implant.  SURGEON:  Karrine Kluttz, M.D.  ASSISTANT:  Benjamin T. Hoxworth, M.D.  ANESTHESIA:  General.  ESTIMATED BLOOD LOSS:  2 mL.  CLINICAL NOTE:  An 79-year-old woman has had previous breast implant, subglandular.  She has a right breast cancer.  This is a fairly large cancer that has involved to the capsule of the breast implant on the right side.  A mastectomy is planned by her general surgeon, and a total capsulectomy recommended due to involvement of the capsule by the cancer.  This needs to be a total capsulectomy in order to gain clear margins.  Next, this procedure and risks were discussed with her in detail.  Reconstructive surgery was also discussed with her, and she declined that and preferred just to treat the cancer at the present time.  She understood the nature of the procedure, and risks include, but not limited to, bleeding, infection, healing problems, scarring, loss of sensation, fluid accumulations chest wall deformity and asymmetry with disappointment and understood all of this and wished to proceed.  DESCRIPTION OF PROCEDURE:  Dr. Hoxworth, the general surgeon had performed the mastectomy portion mobilizing the superior and inferior skin flaps.  He did also complete the sentinel lymph node.  The dissection then carried deep to the breast capsule along the deep surface lifting it directly off of the pectoralis major muscle.  The capsule was removed in its entirety along with  the implant and implant material that was contained within due to the ruptured implant.  This completed an en bloc resection of the cancer and the right breast.  Dr. Hoxworth then completed the closure of the skin of the right chest and also placed a drain.  She tolerated procedure well.     Mayce Noyes, M.D.     DB/MEDQ  D:  04/03/2013  T:  04/03/2013  Job:  [id number]  cc:   Benjamin T. Hoxworth, M.D. 

## 2013-04-03 NOTE — Anesthesia Postprocedure Evaluation (Signed)
Anesthesia Post Note  Patient: Rhonda Bryant  Procedure(s) Performed: Procedure(s) (LRB): TOTAL MASTECTOMY WITH AXILLARY SENTINEL NODE BIOPSY (Right) TOTAL CAPSULECTOMY/REMOVAL OF IMPLANT MATERIAL RIGHT BREAST (Right)  Anesthesia type: general  Patient location: PACU  Post pain: Pain level controlled  Post assessment: Patient's Cardiovascular Status Stable  Last Vitals:  Filed Vitals:   04/03/13 1124  BP: 113/52  Pulse: 88  Temp:   Resp: 19    Post vital signs: Reviewed and stable  Level of consciousness: sedated  Complications: No apparent anesthesia complications

## 2013-04-03 NOTE — Transfer of Care (Signed)
Immediate Anesthesia Transfer of Care Note  Patient: Rhonda Bryant  Procedure(s) Performed: Procedure(s): TOTAL MASTECTOMY WITH AXILLARY SENTINEL NODE BIOPSY (Right) TOTAL CAPSULECTOMY/REMOVAL OF IMPLANT MATERIAL RIGHT BREAST (Right)  Patient Location: PACU  Anesthesia Type:General  Level of Consciousness: awake, alert  and oriented  Airway & Oxygen Therapy: Patient Spontanous Breathing and Patient connected to nasal cannula oxygen  Post-op Assessment: Report given to PACU RN, Post -op Vital signs reviewed and stable and Patient moving all extremities X 4  Post vital signs: Reviewed and stable  Complications: No apparent anesthesia complications

## 2013-04-03 NOTE — H&P (View-Only) (Signed)
Chief Complaint: New diagnosis of breast cancer  History:    Rhonda Bryant is a 79 y.o. postmenopausal female referred by Dr. Annia Belt  for evaluation of recently diagnosed carcinoma of the right breast.  The patient has a personal history of invasive cancer of the left breast in 1989 treated with modified mastectomy and hormonal treatment. She had a tissue expander and implant reconstruction on the left side at that time and had a subglandular implant placed on the right side for symmetry. The patient states that for approximately 4-6 months she has felt a lump in the upper inner right breast. She recently presented for a screening mamogram revealing A probable mass in the right breast..  Subsequent imaging included diagnostic mamogram showing A suspicious mass as well as a rupture in the superior portion of her implant and ultrasound showing A 2.3 cm hypoechoic lobular mass 4 cm from the nipple at the 2:00 position..   A ultrasound guided biopsy was performed on 03/05/2013 with pathology revealing infiltrating ductal carcinoma of the breast. She is seen now in [facility] office for initial treatment planning.  She has experienced A breast lump as above for about 4 months but no skin changes or nipple discharge or pain..  She does have a personal history of Contralateral breast cancer as above in 1989.Marland Kitchen  Findings at that time were the following:  Tumor size: 2.8 cm  Tumor grade: 2  Estrogen Receptor: positive Progesterone Receptor: negative  Her-2 neu: negative  Lymph node status: negative Neurovascular invasion: no Lymphatic invasion: no  Past Medical History  Diagnosis Date  . Chronic airway obstruction, not elsewhere classified   . Allergic rhinitis, cause unspecified   . Unspecified essential hypertension   . Osteoporosis   . Hypertension   . Arthritis   . Blood transfusion without reported diagnosis   . Cancer   . Heart murmur     Past Surgical History  Procedure Laterality Date   . Mastectomy      left  . Cataract extraction    . Tonsillectomy      Current Outpatient Prescriptions  Medication Sig Dispense Refill  . acetaminophen (TYLENOL) 500 MG tablet Take 500 mg by mouth every 6 (six) hours as needed.        Marland Kitchen albuterol (PROVENTIL HFA) 108 (90 BASE) MCG/ACT inhaler Inhale 2 puffs into the lungs every 6 (six) hours as needed for wheezing or shortness of breath (rescue inhaler).  1 Inhaler  0  . Aloe-Sodium Chloride (AYR SALINE NASAL GEL NA) Place into the nose.      Marland Kitchen ALPRAZolam (XANAX) 0.25 MG tablet Take 0.25 mg by mouth at bedtime as needed.        Marland Kitchen amLODipine (NORVASC) 10 MG tablet Take 5 mg by mouth daily.      . Calcium-Vitamin D 600-125 MG-UNIT TABS Take 1 tablet by mouth 2 (two) times daily.        . Fluticasone-Salmeterol (ADVAIR DISKUS) 100-50 MCG/DOSE AEPB Inhale 1 puff into the lungs 2 (two) times daily. Rinse mouth  60 each  11  . loratadine (CLARITIN) 10 MG tablet Take 10 mg by mouth daily.      . mometasone (NASONEX) 50 MCG/ACT nasal spray Place 2 sprays into the nose daily.  17 g  11  . Multiple Vitamin (MULTIVITAMIN) capsule Take 1 capsule by mouth daily.        Marland Kitchen MYRBETRIQ 25 MG TB24 Take 1 tablet by mouth daily.  No current facility-administered medications for this visit.    Family History  Problem Relation Age of Onset  . Pancreatic cancer Father   . Cancer Father     pancreatic  . Breast cancer Mother   . Cancer Mother     breast    History   Social History  . Marital Status: Married    Spouse Name: N/A    Number of Children: N/A  . Years of Education: N/A   Social History Main Topics  . Smoking status: Former Games developer  . Smokeless tobacco: Never Used  . Alcohol Use: Yes     Comment: rarely  . Drug Use: No  . Sexual Activity: None   Other Topics Concern  . None   Social History Narrative  . None     Review of Systems Constitutional: negative Respiratory: positive for asthma and chronic  bronchitis Cardiovascular: negative Gastrointestinal: negative     Objective:  BP 132/80  Pulse 80  Temp(Src) 98.9 F (37.2 C) (Temporal)  Resp 14  Ht 5\' 4"  (1.626 m)  Wt 113 lb 3.2 oz (51.347 kg)  BMI 19.42 kg/m2  General: Thin alert Caucasian female appearing younger than her stated age, in no distress Skin: Warm and dry without rash or infection. HEENT: No palpable masses or thyromegaly. Sclera nonicteric. Pupils equal round and reactive. Oropharynx clear. Breasts: Right breast with implant. In the upper inner quadrant is an area of bruising from the biopsy as well as an approximately 3 x 0.5 cm smooth freely movable palpable mass. No other masses in this breast. He'll reconstruction on the left with no skin change or masses. No axillary adenopathy palpable on either side. Lymph nodes: No cervical, supraclavicular, or inguinal nodes palpable. Lungs: Breath sounds clear and equal without increased work of breathing Cardiovascular: Regular rate and rhythm without murmur. No JVD or edema. Peripheral pulses intact. Abdomen: Nondistended. Soft and nontender. No masses palpable. No organomegaly. No palpable hernias. Extremities: No edema or joint swelling or deformity. No chronic venous stasis changes. Neurologic: Alert and fully oriented. Gait normal.   Laboratory data:  CBC:  Lab Results  Component Value Date   WBC 13.8* 12/16/2010   RBC 4.11 12/16/2010   HGB 12.7 12/16/2010   HCT 36.5 12/16/2010   PLT 287 12/16/2010  ]  [facility] Labs:  Lab Results  Component Value Date   NA 138 08/19/2012   K 3.7 08/19/2012   CL 102 08/19/2012   CO2 28 08/19/2012   BUN 14 08/19/2012   CREATININE 0.7 08/19/2012   CALCIUM 8.7 08/19/2012     Assessment  79 y.o. female with a new diagnosis of cancer of the the right breast upper inner quadrant.  Clinical IB, estrogen receptor positive. I discussed with the patient and family members present today initial surgical treatment options. We discussed options of  breast conservation with lumpectomy or total mastectomy and sentinal lymph node biopsy/dissection. Options for reconstruction were discussed. She has a ruptured implant on the right and very thin breast tissue over this at the site of the tumor. A lumpectomy would be somewhat difficult due to the implant. She may need her implant removed regardless of the type of surgery. After discussion they have elected to proceed with right total mastectomy. She would like to avoid radiation if possible. We discussed the indications and nature of the procedure, and expected recovery, in detail. Surgical risks including anesthetic complications, cardiorespiratory complications, bleeding, infection, wound healing complications, blood clots, lymphedema, local and distant  recurrence and possible need for further surgery based on the final pathology was discussed and understood. We are going to get a plastic surgeon evaluation prior to surgical therapy to advise on possible removal of her implant which I think would be absolutely necessary mastectomy which is what she would prefer and the possibility of reconstruction if she would like. Chemotherapy, hormonal therapy and radiation therapy have been discussed. They have been provided with literature regarding the treatment of breast cancer.  All questions were answered. I will call the patient to confirm the plan at scheduling after plastic surgery evaluation..  Plan right total mastectomy and sentinel lymph node biopsy and likely removal of her implant =/- reconstruction pending her evaluation.  Mariella Saa MD, FACS  03/13/2013, 3:41 PM

## 2013-04-03 NOTE — Op Note (Signed)
NAMEADELYNN, GIPE NO.:  [id number]  MEDICAL RECORD NO.:  [id number]  LOCATION:                               FACILITY:  [facility]  PHYSICIAN:  Etter Sjogren, M.D.     DATE OF BIRTH:  Jan 27, 1925  DATE OF PROCEDURE:  04/03/2013 DATE OF DISCHARGE:  04/04/2013                              OPERATIVE REPORT   PREOPERATIVE DIAGNOSIS:  Breast cancer.  POSTOPERATIVE DIAGNOSIS:  Breast cancer.  PROCEDURE PERFORMED:  Right total capsulectomy and removal of right chest implant.  SURGEON:  Etter Sjogren, M.D.  ASSISTANT:  Lorne Skeens. Hoxworth, M.D.  ANESTHESIA:  General.  ESTIMATED BLOOD LOSS:  2 mL.  CLINICAL NOTE:  An 79 year old woman has had previous breast implant, subglandular.  She has a right breast cancer.  This is a fairly large cancer that has involved to the capsule of the breast implant on the right side.  A mastectomy is planned by her general surgeon, and a total capsulectomy recommended due to involvement of the capsule by the cancer.  This needs to be a total capsulectomy in order to gain clear margins.  Next, this procedure and risks were discussed with her in detail.  Reconstructive surgery was also discussed with her, and she declined that and preferred just to treat the cancer at the present time.  She understood the nature of the procedure, and risks include, but not limited to, bleeding, infection, healing problems, scarring, loss of sensation, fluid accumulations chest wall deformity and asymmetry with disappointment and understood all of this and wished to proceed.  DESCRIPTION OF PROCEDURE:  Dr. Johna Sheriff, the general surgeon had performed the mastectomy portion mobilizing the superior and inferior skin flaps.  He did also complete the sentinel lymph node.  The dissection then carried deep to the breast capsule along the deep surface lifting it directly off of the pectoralis major muscle.  The capsule was removed in its entirety along with  the implant and implant material that was contained within due to the ruptured implant.  This completed an en bloc resection of the cancer and the right breast.  Dr. Johna Sheriff then completed the closure of the skin of the right chest and also placed a drain.  She tolerated procedure well.     Etter Sjogren, M.D.     DB/MEDQ  D:  04/03/2013  T:  04/03/2013  Job:  [id number]  cc:   Lorne Skeens. Hoxworth, M.D.

## 2013-04-03 NOTE — Anesthesia Preprocedure Evaluation (Addendum)
Anesthesia Evaluation  Patient identified by MRN, date of birth, ID band Patient awake    Reviewed: Allergy & Precautions, H&P , NPO status , Patient's Chart, lab work & pertinent test results, reviewed documented beta blocker date and time   Airway Mallampati: II TM Distance: <3 FB Neck ROM: Full    Dental  (+) Teeth Intact and Dental Advisory Given   Pulmonary shortness of breath and with exertion, asthma , pneumonia -, resolved, COPD COPD inhaler,  breath sounds clear to auscultation        Cardiovascular hypertension, Pt. on medications Rhythm:Regular Rate:Normal     Neuro/Psych Anxiety    GI/Hepatic   Endo/Other    Renal/GU      Musculoskeletal   Abdominal   Peds  Hematology   Anesthesia Other Findings   Reproductive/Obstetrics                           Anesthesia Physical Anesthesia Plan  ASA: II  Anesthesia Plan: General   Post-op Pain Management:    Induction: Intravenous  Airway Management Planned: Oral ETT  Additional Equipment:   Intra-op Plan:   Post-operative Plan: Extubation in OR  Informed Consent: I have reviewed the patients History and Physical, chart, labs and discussed the procedure including the risks, benefits and alternatives for the proposed anesthesia with the patient or authorized representative who has indicated his/her understanding and acceptance.   Dental advisory given  Plan Discussed with: CRNA and Surgeon  Anesthesia Plan Comments:        Anesthesia Quick Evaluation

## 2013-04-03 NOTE — Progress Notes (Signed)
Chaplain responded to consult from patient's priest who was not available to visit her. Patient was alert and friendly but said that she was very tired. She explained that she had had a mastectomy and would be released tomorrow. She also had breast cancer 25 years ago and stressed the importance of having check-ups. Patient stated that she was doing well otherwise.

## 2013-04-03 NOTE — Brief Op Note (Signed)
04/03/2013  9:18 AM  PATIENT:  Rhonda Bryant  79 y.o. female  PRE-OPERATIVE DIAGNOSIS:  right breast cancer  POST-OPERATIVE DIAGNOSIS:  right breast cancer  PROCEDURE:  Procedure(s): TOTAL MASTECTOMY WITH AXILLARY SENTINEL NODE BIOPSY (Right) TOTAL CAPSULECTOMY/REMOVAL OF IMPLANT MATERIAL RIGHT BREAST (Right)  SURGEON:  Surgeon(s) and Role: Panel 1:    * Mariella Saa, MD - Primary  Panel 2:    * Etter Sjogren, MD - Primary  PHYSICIAN ASSISTANT:   ASSISTANTS: Dr. Glenna Fellows   ANESTHESIA:   general  EBL:     BLOOD ADMINISTERED:none  DRAINS: (1) Jackson-Pratt drain(s) with closed bulb suction in the right chest (placed by Dr. Johna Sheriff)   LOCAL MEDICATIONS USED:  NONE  SPECIMEN:  Source of Specimen:  right breast capsule (attached to right mastectomy specimen)  DISPOSITION OF SPECIMEN:  PATHOLOGY  COUNTS:  YES  TOURNIQUET:  * No tourniquets in log *  DICTATION: .Other Dictation: Dictation Number [id number]  PLAN OF CARE: Admit for overnight observation  PATIENT DISPOSITION:  PACU - hemodynamically stable.   Delay start of Pharmacological VTE agent (>24hrs) due to surgical blood loss or risk of bleeding: yes

## 2013-04-03 NOTE — Op Note (Signed)
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 She has a history of previous left breast cancer with reconstruction. She had an implant placed on the right side for symmetry at that time. Evaluation also indicates rupture of the implant. After discussion of treatment options with me and with plastic surgery she has elected to proceed with total mastectomy with sentinel lymph node biopsy with removal of her implant and capsule. We previously discussed indications and risks detailed extensively elsewhere.    Procedure Detail:  Patient was brought to the operating room, placed in the supine position on the operating table and laryngeal mask general anesthesia induced. She was carefully positioned with the right arm extended. She was given preoperative IV antibiotics. PAS were in place. The entire right chest and axilla and upper arm were widely sterilely prepped and draped. After patient timeout was performed cc of dilute methylene blue was injected subcutaneously beneath the right nipple and massaged for several minutes. An elliptical incision was planned encompassing the nipple areolar complex and also the skin directly overlying the tumor as it was very close to the skin. The incision was made with the plasma knife and skin and subcutaneous flaps were then raised superiorly to the clavicle, medially to the edge of the sternum, inferiorly to the inframammary crease and laterally out to the anterior border of the latissimus. The axilla was exposed and using the neoprobe a  definite hot spot was localized. Dissection was carried down onto this and to blue lymph nodes were dissected out one with counts of greater than 5000 and the other with counts of greater than 2000. Background in the axilla to this point was less than 100. These were sent for touch prep. While waiting for this we proceeded with the mastectomy and capsulectomy with Dr. Odis Luster completely excising the capsule of the implant and the overlying breast tissue up off of the pectoralis and serratus muscles and dissecting this away from the latissimus up to the low axilla we came across with the plasma knife. The specimen was oriented and sent for permanent section. The touch prep on the sentinel lymph nodes returned as negative for carcinoma. The wound was thoroughly irrigated and complete hemostasis obtained. A 19 Blake drain was left through a separate stab wound and placed beneath the flaps. The saphenous tissue was closed with interrupted 3-0 Vicryl. The skin was closed with subcuticular 5-0 Monocryl and Dermabond. Sponge needle and instrument counts were correct.    Findings: As above  Estimated Blood Loss:  Minimal         Drains: 19 Blake  Blood Given: none          Specimens: #1 right total mastectomy with implant and capsule   #2 right axillary sentinel lymph node #1       #3 right axillary sentinel lymph node #2        Complications:  * No complications entered in OR log *         Disposition: PACU - hemodynamically stable.         Condition: stable

## 2013-04-03 NOTE — Anesthesia Procedure Notes (Signed)
Procedure Name: Intubation Date/Time: 04/03/2013 7:50 AM Performed by: Mariella Saa Pre-anesthesia Checklist: Patient identified, Timeout performed, Emergency Drugs available, Suction available and Patient being monitored Patient Re-evaluated:Patient Re-evaluated prior to inductionOxygen Delivery Method: Circle system utilized and Simple face mask Preoxygenation: Pre-oxygenation with 100% oxygen Intubation Type: IV induction Ventilation: Mask ventilation without difficulty Laryngoscope Size: Mac and 3 Grade View: Grade III Tube type: Oral Tube size: 7.0 mm Number of attempts: 2 Airway Equipment and Method: Patient positioned with wedge pillow and Stylet Placement Confirmation: ETT inserted through vocal cords under direct vision,  positive ETCO2 and breath sounds checked- equal and bilateral Secured at: 23 cm Tube secured with: Tape Dental Injury: Teeth and Oropharynx as per pre-operative assessment  Difficulty Due To: Difficulty was anticipated and Difficult Airway- due to anterior larynx Comments: Grade 3 Anterior view

## 2013-04-04 LAB — BASIC METABOLIC PANEL
CO2: 26 mEq/L (ref 19–32)
Calcium: 8.4 mg/dL (ref 8.4–10.5)
Chloride: 104 mEq/L (ref 96–112)
GFR calc Af Amer: 86 mL/min — ABNORMAL LOW (ref >90)
GFR calc non Af Amer: 74 mL/min — ABNORMAL LOW (ref >90)
Glucose, Bld: 135 mg/dL — ABNORMAL HIGH (ref 70–99)
Sodium: 139 mEq/L (ref 135–145)

## 2013-04-04 LAB — CBC
MCH: 30.7 pg (ref 26.0–34.0)
MCHC: 33.6 g/dL (ref 30.0–36.0)
Platelets: 219 10*3/uL (ref 150–400)
RBC: 3.78 MIL/uL — ABNORMAL LOW (ref 3.87–5.11)
RDW: 13.3 % (ref 11.5–15.5)

## 2013-04-04 MED ORDER — HYDROMORPHONE HCL 2 MG PO TABS: 2.0000 mg | ORAL_TABLET | ORAL | Status: DC | PRN

## 2013-04-04 NOTE — Discharge Summary (Signed)
Physician Discharge Summary  Patient ID: DNASIA GAUNA MRN: [id number] DOB/AGE: 09/16/1924 79 y.o.  Admit date: 04/03/2013 Discharge date: 04/04/2013  Admission Diagnoses:Right breast cancer  Discharge Diagnoses: Same Active Problems:   * No active hospital problems. *   Discharged Condition: good  Hospital Course: On the day of admission the patient was taken to surgery and had right mastectomy, sentinel node, right total capsulectomy and removal of breast implant. The patient tolerated the procedures well. Postoperatively, the mastectomy flaps maintained excellent color and capillary refill. The patient was ambulatory and tolerating diet on the first postoperative day. She received DVT prophylaxis. She is doing well with minimal discomfort and would like to go home.  Treatments: antibiotics: Ancef, anticoagulation: heparin and surgery: right mastectomy, sentinel node, capsulectomy, and explantation  Discharge Exam: Blood pressure 149/67, pulse 75, temperature 98.4 F (36.9 C), temperature source Oral, resp. rate 16, height 5\' 4"  (1.626 m), weight 114 lb 3.2 oz (51.8 kg), SpO2 96.00%.  Operative sites: Mastectomy flaps viable. Drain functioning. Drainage thin. No evidence of bleeding or infection.  Disposition: 01-Home or Self Care   Future Appointments Provider Department Dept Phone   08/06/2013 11:00 AM Waymon Budge, MD [facility] [phone]       Medication List         acetaminophen 500 MG tablet  Commonly known as:  TYLENOL  Take 500 mg by mouth every 6 (six) hours as needed for pain.     albuterol 108 (90 BASE) MCG/ACT inhaler  Commonly known as:  PROVENTIL HFA;VENTOLIN HFA  Inhale 2 puffs into the lungs every 6 (six) hours as needed for wheezing or shortness of breath.     ALPRAZolam 0.25 MG tablet  Commonly known as:  XANAX  Take 0.25 mg by mouth 2 (two) times daily as needed for sleep or anxiety.     amLODipine 10 MG tablet  Commonly known  as:  NORVASC  Take 5 mg by mouth daily.     calcium carbonate 750 MG chewable tablet  Commonly known as:  TUMS EX  Chew 1 tablet by mouth 2 (two) times daily as needed for heartburn.     Calcium-Vitamin D 600-125 MG-UNIT Tabs  Take 1 tablet by mouth 2 (two) times daily.     Fluticasone-Salmeterol 100-50 MCG/DOSE Aepb  Commonly known as:  ADVAIR DISKUS  Inhale 1 puff into the lungs 2 (two) times daily. Rinse mouth     HYDROmorphone 2 MG tablet  Commonly known as:  DILAUDID  Take 1 tablet (2 mg total) by mouth every 4 (four) hours as needed for pain.     loratadine 10 MG tablet  Commonly known as:  CLARITIN  Take 10 mg by mouth daily.     mometasone 50 MCG/ACT nasal spray  Commonly known as:  NASONEX  Place 2 sprays into the nose daily as needed (for congestion/allergies).     multivitamin capsule  Take 1 capsule by mouth daily.     MYRBETRIQ 25 MG Tb24 tablet  Generic drug:  mirabegron ER  Take 25 mg by mouth daily.         SignedOdis Luster, Ulyses Panico M 04/04/2013, 9:29 AM

## 2013-04-06 ENCOUNTER — Encounter (HOSPITAL_COMMUNITY): Payer: Self-pay | Admitting: General Surgery

## 2013-04-07 ENCOUNTER — Telehealth (INDEPENDENT_AMBULATORY_CARE_PROVIDER_SITE_OTHER): Payer: Self-pay | Admitting: General Surgery

## 2013-04-07 NOTE — Telephone Encounter (Signed)
Called the patient and discussed path report  

## 2013-04-29 ENCOUNTER — Encounter (INDEPENDENT_AMBULATORY_CARE_PROVIDER_SITE_OTHER): Payer: Self-pay | Admitting: General Surgery

## 2013-04-29 ENCOUNTER — Other Ambulatory Visit (INDEPENDENT_AMBULATORY_CARE_PROVIDER_SITE_OTHER): Payer: Self-pay

## 2013-04-29 ENCOUNTER — Ambulatory Visit (INDEPENDENT_AMBULATORY_CARE_PROVIDER_SITE_OTHER): Payer: Medicare Other | Admitting: General Surgery

## 2013-04-29 VITALS — BP 164/82 | HR 84 | Resp 20 | Ht 64.0 in | Wt 112.6 lb

## 2013-04-29 DIAGNOSIS — C50219 Malignant neoplasm of upper-inner quadrant of unspecified female breast: Secondary | ICD-10-CM

## 2013-04-29 DIAGNOSIS — C50919 Malignant neoplasm of unspecified site of unspecified female breast: Secondary | ICD-10-CM

## 2013-04-29 DIAGNOSIS — C50211 Malignant neoplasm of upper-inner quadrant of right female breast: Secondary | ICD-10-CM

## 2013-04-29 NOTE — Progress Notes (Signed)
History: The patient returns to the office for postop followup status post right total mastectomy and sentinel lymph node biopsy for T2 N0 ER positive PR negative invasive cancer of the right breast. She also has a remote history of left breast cancer in the 1980s. She had an implant on the right that was removed at the time. Dr. Odis Luster saw her back and took her drain out. She feels well. She is ready to return to work and resume normal activities.  Exam: BP 164/82  Pulse 84  Resp 20  Ht 5\' 4"  (1.626 m)  Wt 112 lb 9.6 oz (51.075 kg)  BMI 19.32 kg/m2 General: Appears well Chest wall right mastectomy wound is very nicely healed without fluid collection or other complication.  We reviewed her pathology which revealed a 2.4 cm primary tumor, grade 2, prognostic panel as above with 2 negative sentinel lymph nodes. He  Assessment and plan: Doing well following right total mastectomy. She is very active and in excellent health for her age and hormonal treatment might be reasonable. We will refer her to the cancer Center for evaluation. Return here in 6 weeks.

## 2013-04-30 ENCOUNTER — Encounter: Payer: Self-pay | Admitting: *Deleted

## 2013-04-30 ENCOUNTER — Other Ambulatory Visit (INDEPENDENT_AMBULATORY_CARE_PROVIDER_SITE_OTHER): Payer: Self-pay | Admitting: *Deleted

## 2013-04-30 DIAGNOSIS — C50919 Malignant neoplasm of unspecified site of unspecified female breast: Secondary | ICD-10-CM

## 2013-04-30 MED ORDER — UNABLE TO FIND: Status: DC

## 2013-04-30 NOTE — Progress Notes (Signed)
Received referral in EPIC.  Cannot schedule within my protocol - gave paperwork to [facility] to obtain appts from MD's so I can schedule.  Emailed Christy at [facility] to make her aware.

## 2013-05-01 ENCOUNTER — Telehealth: Payer: Self-pay | Admitting: *Deleted

## 2013-05-01 NOTE — Telephone Encounter (Signed)
Received an appt from Dr. Welton Flakes and I called & left a message for the pt to return my call so I can schedule her.

## 2013-05-04 ENCOUNTER — Telehealth: Payer: Self-pay | Admitting: *Deleted

## 2013-05-04 NOTE — Telephone Encounter (Signed)
Left message for pt to return my call so I can schedule her a med onc appt.

## 2013-05-06 ENCOUNTER — Telehealth: Payer: Self-pay | Admitting: *Deleted

## 2013-05-06 NOTE — Telephone Encounter (Signed)
Left message for pt to return my call so I can schedule her w/ a med onc.  Emailed Christy at [facility] to make her aware that I cannot get in touch with the pt. 

## 2013-05-12 ENCOUNTER — Telehealth: Payer: Self-pay | Admitting: *Deleted

## 2013-05-12 NOTE — Telephone Encounter (Signed)
Got in touch with pt and gave her an appt to see Dr. Welton Flakes and she is refusing to see her.  Informed her that I would need to go to Dr. Darnelle Catalan for an appt date and time and I would call her back.

## 2013-05-12 NOTE — Telephone Encounter (Signed)
Obtained date and time from Dr. Darnelle Catalan.  Called pt and confirmed 05/22/13 appt w/ pt.  Mailed before appt letter, welcome packet & intake form to pt.  Emailed Christy at [facility] to make aware.   Took paperwork to [facility] for chart.

## 2013-05-19 ENCOUNTER — Encounter: Payer: Self-pay | Admitting: *Deleted

## 2013-05-19 ENCOUNTER — Other Ambulatory Visit: Payer: Self-pay | Admitting: *Deleted

## 2013-05-19 DIAGNOSIS — C50211 Malignant neoplasm of upper-inner quadrant of right female breast: Secondary | ICD-10-CM

## 2013-05-19 NOTE — Progress Notes (Signed)
Gave chart to [facility] to enter labs & give back to me.

## 2013-05-20 ENCOUNTER — Encounter: Payer: Self-pay | Admitting: *Deleted

## 2013-05-20 NOTE — Progress Notes (Signed)
Received chart back from Keisha and placed in Dr. Magrinat's box.  

## 2013-05-22 ENCOUNTER — Ambulatory Visit (HOSPITAL_BASED_OUTPATIENT_CLINIC_OR_DEPARTMENT_OTHER): Payer: Medicare Other | Admitting: Oncology

## 2013-05-22 ENCOUNTER — Ambulatory Visit (INDEPENDENT_AMBULATORY_CARE_PROVIDER_SITE_OTHER): Payer: Medicare Other

## 2013-05-22 ENCOUNTER — Ambulatory Visit: Payer: Medicare Other

## 2013-05-22 ENCOUNTER — Telehealth: Payer: Self-pay | Admitting: Oncology

## 2013-05-22 ENCOUNTER — Other Ambulatory Visit (HOSPITAL_BASED_OUTPATIENT_CLINIC_OR_DEPARTMENT_OTHER): Payer: Medicare Other

## 2013-05-22 ENCOUNTER — Encounter: Payer: Self-pay | Admitting: Oncology

## 2013-05-22 VITALS — BP 162/68 | HR 94 | Temp 97.9°F | Resp 18 | Ht 64.0 in | Wt 112.7 lb

## 2013-05-22 DIAGNOSIS — C50219 Malignant neoplasm of upper-inner quadrant of unspecified female breast: Secondary | ICD-10-CM

## 2013-05-22 DIAGNOSIS — M810 Age-related osteoporosis without current pathological fracture: Secondary | ICD-10-CM

## 2013-05-22 DIAGNOSIS — J309 Allergic rhinitis, unspecified: Secondary | ICD-10-CM

## 2013-05-22 DIAGNOSIS — Z170 Estrogen receptor positive status [ER+]: Secondary | ICD-10-CM

## 2013-05-22 DIAGNOSIS — C50919 Malignant neoplasm of unspecified site of unspecified female breast: Secondary | ICD-10-CM

## 2013-05-22 DIAGNOSIS — C50211 Malignant neoplasm of upper-inner quadrant of right female breast: Secondary | ICD-10-CM

## 2013-05-22 LAB — CBC WITH DIFFERENTIAL/PLATELET
BASO%: 0.7 % (ref 0.0–2.0)
EOS%: 2.2 % (ref 0.0–7.0)
Eosinophils Absolute: 0.2 10*3/uL (ref 0.0–0.5)
HGB: 12.8 g/dL (ref 11.6–15.9)
MONO#: 0.6 10*3/uL (ref 0.1–0.9)
NEUT#: 4.2 10*3/uL (ref 1.5–6.5)
NEUT%: 59.4 % (ref 38.4–76.8)
Platelets: 207 10*3/uL (ref 145–400)
RBC: 4.16 10*6/uL (ref 3.70–5.45)
RDW: 13.1 % (ref 11.2–14.5)
WBC: 7 10*3/uL (ref 3.9–10.3)
lymph#: 2 10*3/uL (ref 0.9–3.3)

## 2013-05-22 LAB — COMPREHENSIVE METABOLIC PANEL (CC13)
ALT: 12 U/L (ref 0–55)
AST: 19 U/L (ref 5–34)
Albumin: 3.7 g/dL (ref 3.5–5.0)
Anion Gap: 11 mEq/L (ref 3–11)
CO2: 27 mEq/L (ref 22–29)
Creatinine: 0.8 mg/dL (ref 0.6–1.1)
Glucose: 95 mg/dl (ref 70–140)
Potassium: 3.8 mEq/L (ref 3.5–5.1)
Sodium: 143 mEq/L (ref 136–145)
Total Bilirubin: 0.27 mg/dL (ref 0.20–1.20)
Total Protein: 7.3 g/dL (ref 6.4–8.3)

## 2013-05-22 MED ORDER — ANASTROZOLE 1 MG PO TABS: 1.0000 mg | ORAL_TABLET | Freq: Every day | ORAL | Status: DC

## 2013-05-22 NOTE — Progress Notes (Signed)
ID: Yvette Rack OB: 01/27/1925  MR#: [id number]  UYQ#:[id number]  PCP: Lupita Raider, MD GYN:   SU: Glenna Fellows OTHER MD: Etter Sjogren  CHIEF COMPLAINT: "I have another breast cancer"  HISTORY OF PRESENT ILLNESS: Patch herself noted a change in her right breast, and eventually brought it to the attention of Dr. Clelia Croft, who set her up for right mammography and ultrasonography at the breast center 03/02/2013. Implant displaced views could not be obtained and no definite mass could be identified on the limited mammographic figures. However on physical exam there was a firm mass palpable at the 2:00 position of the right breast ultrasound showed this to measure 2.3 cm to be lobular and hypoechoic. There was no right axillary lymph adenopathy identified.  Biopsy of this mass 03/05/2013 showed ([id number]) and invasive ductal carcinoma, grade 1 or 2, estrogen receptor 100% positive with strong staining intensity, progesterone receptor and HER-2 negative with an MIB-1 of 20%.  On 04/03/2013 the patient underwent right total mastectomy and sentinel lymph node biopsy with total capsulotomy and removal of all implant material. The pathology from this procedure ([id number]) confirmed an invasive ductal carcinoma, grade 2, measuring 1.9 cm, with ample margins. Both sentinel lymph nodes were clear. Repeat HER-2 testing was again negative.  The patient's subsequent history is as detailed below  INTERVAL HISTORY: That was evaluated at the breast clinic 05/22/2013  REVIEW OF SYSTEMS: She did well with her surgery, without unusual pain, fever, rash, bleeding, or swelling. She has a variety of chronic issues including insomnia, mild fatigue, double vision which developed she tells me after cataract surgery and for which she uses a right present, mild occasional epistaxis, significant gum disease, rare bilateral ankle swelling, COPD, with sleeping on 2 pillows, and fairly common dry cough, history of  heartburn, stress urinary incontinence, easy bruising, low back pain, and anxiety. A detailed review of systems today was otherwise noncontributory  PAST MEDICAL HISTORY: Past Medical History  Diagnosis Date  . Chronic airway obstruction, not elsewhere classified   . Allergic rhinitis, cause unspecified   . Unspecified essential hypertension   . Osteoporosis   . Hypertension   . Arthritis   . Blood transfusion without reported diagnosis   . Cancer   . Heart murmur   . PONV (postoperative nausea and vomiting)   . Depression   . Anxiety   . Shortness of breath     only with lots of activity  . Pneumonia     had twice    PAST SURGICAL HISTORY: Past Surgical History  Procedure Laterality Date  . Mastectomy      left  . Cataract extraction    . Tonsillectomy    . Tonsillectomy    . Colonoscopy    . Simple mastectomy with axillary sentinel node biopsy Right 04/03/2013    Procedure: TOTAL MASTECTOMY WITH AXILLARY SENTINEL NODE BIOPSY;  Surgeon: Mariella Saa, MD;  Location: [facility] OR;  Service: General;  Laterality: Right;  . Capsulectomy Right 04/03/2013    Procedure: TOTAL CAPSULECTOMY/REMOVAL OF IMPLANT MATERIAL RIGHT BREAST;  Surgeon: Etter Sjogren, MD;  Location: [facility] OR;  Service: Plastics;  Laterality: Right;    FAMILY HISTORY Family History  Problem Relation Age of Onset  . Pancreatic cancer Father   . Cancer Father     pancreatic  . Breast cancer Mother   . Cancer Mother     breast   the patient's father died at the age of 61. He had pancreatic cancer. The  patient's mother died at the age of 21. She had been diagnosed with breast cancer in her late 49s. The patient has one brother, 2 sisters. There is no history of ovarian cancer in the family except possibly for the patient's maternal grandmother, who died with what seems to have been some cancer involving the abdomen.  GYNECOLOGIC HISTORY:  Menarche age 41, first live birth age 67, the patient is G[place] P3. She went  through the change of life in her late 42s. She did not take hormone replacement.  SOCIAL HISTORY:  At works as a Ship broker. Her husband H. "[place]" Hollace Michelli. used to work in Research officer, political party, now works 2 hours a week at [employer]. The patient's son Viviann Spare was a marine and took his own life in the setting of what sounds like severe posttraumatic stress syndrome. Son carry took his own life October of 2013. The patient is still grieving that death. The patient's daughter Delila Spence lives in [place]. The patient has 2 grandchildren and 2 great-grandchildren. She at tends [employer]    ADVANCED DIRECTIVES:    HEALTH MAINTENANCE: History  Substance Use Topics  . Smoking status: Former Games developer  . Smokeless tobacco: Never Used  . Alcohol Use: Yes     Comment: rarely     Colonoscopy:   PAP:  Bone density: Osteoporosis, per patient report  Lipid panel:  Allergies  Allergen Reactions  . Codeine     REACTION: nausea  . Nsaids     Pt states to avoid this med  . Other     Ragweed and Dust  . Sulfamethoxazole-Trimethoprim     REACTION: nausea    Current Outpatient Prescriptions  Medication Sig Dispense Refill  . acetaminophen (TYLENOL) 500 MG tablet Take 500 mg by mouth every 6 (six) hours as needed for pain.       Marland Kitchen albuterol (PROVENTIL HFA;VENTOLIN HFA) 108 (90 BASE) MCG/ACT inhaler Inhale 2 puffs into the lungs every 6 (six) hours as needed for wheezing or shortness of breath.      . ALPRAZolam (XANAX) 0.25 MG tablet Take 0.25 mg by mouth 2 (two) times daily as needed for sleep or anxiety.       Marland Kitchen amLODipine (NORVASC) 10 MG tablet Take 5 mg by mouth daily.      . calcium carbonate (TUMS EX) 750 MG chewable tablet Chew 1 tablet by mouth 2 (two) times daily as needed for heartburn.      . Calcium-Vitamin D 600-125 MG-UNIT TABS Take 1 tablet by mouth 2 (two) times daily.        . Fluticasone-Salmeterol (ADVAIR  DISKUS) 100-50 MCG/DOSE AEPB Inhale 1 puff into the lungs 2 (two) times daily. Rinse mouth  60 each  11  . loratadine (CLARITIN) 10 MG tablet Take 10 mg by mouth daily.      . mometasone (NASONEX) 50 MCG/ACT nasal spray Place 2 sprays into the nose daily as needed (for congestion/allergies).      . Multiple Vitamin (MULTIVITAMIN) capsule Take 1 capsule by mouth daily.        Marland Kitchen MYRBETRIQ 25 MG TB24 Take 25 mg by mouth daily.       Marland Kitchen UNABLE TO FIND Rx: L8020- Non-Silicone Breast Prosthesis (Quantity: 1) L8030- Silicone Breast Prosthesis (Quantity: 1) Dx: 174.9; Right Mastectomy  1 each  0   No current facility-administered medications for this visit.    OBJECTIVE: Older white woman in no  acute distress Filed Vitals:   05/22/13 1026  BP: 162/68  Pulse: 94  Temp: 97.9 F (36.6 C)  Resp: 18     Body mass index is 19.34 kg/(m^2).    ECOG FS:0 - Asymptomatic  Ocular: Sclerae unicteric, the patient's right lens includes a prism for diplopia correction Ear-nose-throat: Oropharynx clear and moist; dentition not closely examined Lymphatic: No cervical or supraclavicular adenopathy Lungs no rales or rhonchi, good excursion bilaterally Heart regular rate and rhythm, no murmur appreciated Abd soft, nontender, positive bowel sounds MSK no focal spinal tenderness, no upper extremity lymphedema Neuro: non-focal, well-oriented, pleasant affect affect Breasts: Status post recent right mastectomy. The incision is healing very nicely. There is no erythema, dehiscence, swelling, or unusual tenderness. The right axilla is benign. The left breast is status post mastectomy with implant in place. There is no evidence of local recurrence. The left axilla is benign as well   LAB RESULTS:  CMP     Component Value Date/Time   NA 139 04/04/2013 0343   K 3.8 04/04/2013 0343   CL 104 04/04/2013 0343   CO2 26 04/04/2013 0343   GLUCOSE 135* 04/04/2013 0343   BUN 13 04/04/2013 0343   CREATININE 0.77  04/04/2013 0343   CALCIUM 8.4 04/04/2013 0343   GFRNONAA 74* 04/04/2013 0343   GFRAA 86* 04/04/2013 0343    I No results found for this basename: SPEP, UPEP,  kappa and lambda light chains    Lab Results  Component Value Date   WBC 7.0 05/22/2013   NEUTROABS 4.2 05/22/2013   HGB 12.8 05/22/2013   HCT 38.2 05/22/2013   MCV 91.7 05/22/2013   PLT 207 05/22/2013      Chemistry      Component Value Date/Time   NA 139 04/04/2013 0343   K 3.8 04/04/2013 0343   CL 104 04/04/2013 0343   CO2 26 04/04/2013 0343   BUN 13 04/04/2013 0343   CREATININE 0.77 04/04/2013 0343      Component Value Date/Time   CALCIUM 8.4 04/04/2013 0343       No results found for this basename: LABCA2    No components found with this basename: LABCA125    No results found for this basename: INR,  in the last 168 hours  Urinalysis    Component Value Date/Time   COLORURINE YELLOW 12/15/2010 2158   APPEARANCEUR CLOUDY* 12/15/2010 2158   LABSPEC 1.015 12/15/2010 2158   PHURINE 7.5 12/15/2010 2158   GLUCOSEU NEGATIVE 12/15/2010 2158   HGBUR NEGATIVE 12/15/2010 2158   BILIRUBINUR NEGATIVE 12/15/2010 2158   KETONESUR NEGATIVE 12/15/2010 2158   PROTEINUR NEGATIVE 12/15/2010 2158   UROBILINOGEN 0.2 12/15/2010 2158   NITRITE NEGATIVE 12/15/2010 2158   LEUKOCYTESUR NEGATIVE 12/15/2010 2158    STUDIES: Mammographic studies reviewed  ASSESSMENT: 79 y.o. [place] woman  (1) status post left modified radical mastectomy in 1989; receive tamoxifen adjuvantly for 10 years.  (2) status post right mastectomy and sentinel lymph node sampling 04/03/2013 for a pT2 pN0, stage IIA invasive ductal carcinoma, grade 2, estrogen receptor 100% positive, progesterone receptor negative, with an MIB-1 of 20%, and no HER-2 amplification.  PLAN: We spent the better part of today's hour-long appointment discussing the biology of breast cancer in general, and the specifics of the patient's tumor in particular. Pat's case was discussed at  the multidisciplinary breast cancer conference 05/20/2013, and it was felt at that time that she would not need postmastectomy radiation. Genetics however was suggested because of a  history of bilateral breast cancer. I discussed that with her today and she agreed to a meeting with our geneticist at sometime in the near future.  As far as adjuvant treatment of her stage II breast cancer is concerned, while NCCN guidelines generally recommend chemotherapy in this case, clearly in an 79 year old woman with significant comorbidities there is no role for chemotherapy, as it might even increase her risk of dying rather than the opposite.  Accordingly her adjuvant treatment will consist of anti-estrogens only. Tamoxifen would be an excellent choice for her, given her history of osteoporosis, except for the fact that she really took it for 10 years. We simply do not have data to continue tamoxifen beyond 10 years, although we also do not have data compelling [place] to stop it.  We decided to give anastrozole a trial. We discussed the possible toxicities, side effects and complications of treatment. I went ahead and wrote her the prescription and scheduled her to see me in about 3 months to discuss tolerance. If she tolerates it well we will likely start her on zolendronate yearly. She is already scheduled to meet with her dentist in January and I have asked her to alert him to this possibility so we can get his input.  If she does not tolerate the anastrozole, we will switch to tamoxifen. At has a good understanding of this plan, and agrees with that. She will call us with any problems that may develop before her next visit here.   Lowella Dell, MD   05/22/2013 10:46 AM

## 2013-05-22 NOTE — Telephone Encounter (Signed)
, °

## 2013-05-22 NOTE — Progress Notes (Signed)
Checked in new patient with no financial issues. I gave her [facility] form and she has appt card. She has not been to [place].

## 2013-05-26 ENCOUNTER — Encounter: Payer: Self-pay | Admitting: *Deleted

## 2013-05-26 NOTE — Progress Notes (Signed)
Mailed after appt letter to pt. 

## 2013-06-01 ENCOUNTER — Telehealth: Payer: Self-pay | Admitting: *Deleted

## 2013-06-01 NOTE — Telephone Encounter (Signed)
This RN returned patient's phone call regarding new medication Dr. Darnelle Catalan wants her to start. 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 I don't know how the medicine is gong to affect me so, I am going to start it Christmas Day. I just wanted him to know that I am going to take it." I told her that was fine and to call the office if she had any questions or concerns. Patient verbalized understanding.

## 2013-06-12 ENCOUNTER — Encounter (INDEPENDENT_AMBULATORY_CARE_PROVIDER_SITE_OTHER): Payer: Self-pay

## 2013-06-12 ENCOUNTER — Ambulatory Visit (INDEPENDENT_AMBULATORY_CARE_PROVIDER_SITE_OTHER): Payer: Medicare Other | Admitting: General Surgery

## 2013-06-12 ENCOUNTER — Encounter (INDEPENDENT_AMBULATORY_CARE_PROVIDER_SITE_OTHER): Payer: Self-pay | Admitting: General Surgery

## 2013-06-12 VITALS — BP 140/75 | HR 72 | Temp 98.2°F | Resp 14 | Ht 64.0 in | Wt 112.8 lb

## 2013-06-12 DIAGNOSIS — C50211 Malignant neoplasm of upper-inner quadrant of right female breast: Secondary | ICD-10-CM

## 2013-06-12 DIAGNOSIS — C50219 Malignant neoplasm of upper-inner quadrant of unspecified female breast: Secondary | ICD-10-CM

## 2013-06-12 NOTE — Progress Notes (Signed)
History: Patient returns for more long-term followup status post right total mastectomy and implant removal for a second primary cancer of the right breast, stage IIA T2 N0.  She reports no problems. She has been started on Arimidex and experiencing only minimal hot flashes and no joint pain. She has a prosthesis he works well for her.  Exam: BP 140/75  Pulse 72  Temp(Src) 98.2 F (36.8 C) (Temporal)  Resp 14  Ht 5\' 4"  (1.626 m)  Wt 112 lb 12.8 oz (51.166 kg)  BMI 19.35 kg/m2 General: Appears well Chest wall: For mastectomy incision is well healed without complication Extremities: No arm edema full range of motion  Assessment and plan: Doing well postoperatively. I will plan to see her in 6 months for long-term followup.

## 2013-08-06 ENCOUNTER — Ambulatory Visit: Payer: Medicare Other | Admitting: Internal Medicine

## 2013-08-10 ENCOUNTER — Encounter: Payer: Medicare Other | Admitting: Genetic Counselor

## 2013-08-10 ENCOUNTER — Other Ambulatory Visit: Payer: Medicare Other

## 2013-08-11 ENCOUNTER — Ambulatory Visit (HOSPITAL_BASED_OUTPATIENT_CLINIC_OR_DEPARTMENT_OTHER): Payer: Medicare Other | Admitting: Oncology

## 2013-08-11 ENCOUNTER — Other Ambulatory Visit (HOSPITAL_BASED_OUTPATIENT_CLINIC_OR_DEPARTMENT_OTHER): Payer: Medicare Other

## 2013-08-11 ENCOUNTER — Telehealth: Payer: Self-pay | Admitting: Oncology

## 2013-08-11 VITALS — BP 170/66 | HR 86 | Temp 97.9°F | Resp 18 | Ht 64.0 in | Wt 113.9 lb

## 2013-08-11 DIAGNOSIS — M549 Dorsalgia, unspecified: Secondary | ICD-10-CM

## 2013-08-11 DIAGNOSIS — Z170 Estrogen receptor positive status [ER+]: Secondary | ICD-10-CM

## 2013-08-11 DIAGNOSIS — Z853 Personal history of malignant neoplasm of breast: Secondary | ICD-10-CM

## 2013-08-11 DIAGNOSIS — J310 Chronic rhinitis: Secondary | ICD-10-CM

## 2013-08-11 DIAGNOSIS — R059 Cough, unspecified: Secondary | ICD-10-CM

## 2013-08-11 DIAGNOSIS — M810 Age-related osteoporosis without current pathological fracture: Secondary | ICD-10-CM

## 2013-08-11 DIAGNOSIS — C50219 Malignant neoplasm of upper-inner quadrant of unspecified female breast: Secondary | ICD-10-CM

## 2013-08-11 DIAGNOSIS — J449 Chronic obstructive pulmonary disease, unspecified: Secondary | ICD-10-CM

## 2013-08-11 DIAGNOSIS — R05 Cough: Secondary | ICD-10-CM

## 2013-08-11 DIAGNOSIS — J301 Allergic rhinitis due to pollen: Secondary | ICD-10-CM

## 2013-08-11 DIAGNOSIS — C50211 Malignant neoplasm of upper-inner quadrant of right female breast: Secondary | ICD-10-CM

## 2013-08-11 DIAGNOSIS — C50919 Malignant neoplasm of unspecified site of unspecified female breast: Secondary | ICD-10-CM

## 2013-08-11 DIAGNOSIS — N393 Stress incontinence (female) (male): Secondary | ICD-10-CM

## 2013-08-11 DIAGNOSIS — F411 Generalized anxiety disorder: Secondary | ICD-10-CM

## 2013-08-11 DIAGNOSIS — H532 Diplopia: Secondary | ICD-10-CM

## 2013-08-11 LAB — COMPREHENSIVE METABOLIC PANEL (CC13)
ALBUMIN: 3.8 g/dL (ref 3.5–5.0)
ALT: 13 U/L (ref 0–55)
AST: 18 U/L (ref 5–34)
Alkaline Phosphatase: 76 U/L (ref 40–150)
Anion Gap: 10 mEq/L (ref 3–11)
BUN: 13.4 mg/dL (ref 7.0–26.0)
CALCIUM: 9.7 mg/dL (ref 8.4–10.4)
CHLORIDE: 103 meq/L (ref 98–109)
CO2: 31 mEq/L — ABNORMAL HIGH (ref 22–29)
Creatinine: 0.8 mg/dL (ref 0.6–1.1)
GLUCOSE: 101 mg/dL (ref 70–140)
POTASSIUM: 3.6 meq/L (ref 3.5–5.1)
Sodium: 144 mEq/L (ref 136–145)
Total Bilirubin: 0.33 mg/dL (ref 0.20–1.20)
Total Protein: 7.2 g/dL (ref 6.4–8.3)

## 2013-08-11 LAB — CBC WITH DIFFERENTIAL/PLATELET
BASO%: 0.3 % (ref 0.0–2.0)
Basophils Absolute: 0 10*3/uL (ref 0.0–0.1)
EOS ABS: 0.1 10*3/uL (ref 0.0–0.5)
EOS%: 1.1 % (ref 0.0–7.0)
HCT: 37.7 % (ref 34.8–46.6)
HEMOGLOBIN: 12.3 g/dL (ref 11.6–15.9)
LYMPH#: 2.2 10*3/uL (ref 0.9–3.3)
LYMPH%: 19.2 % (ref 14.0–49.7)
MCH: 30 pg (ref 25.1–34.0)
MCHC: 32.6 g/dL (ref 31.5–36.0)
MCV: 91.9 fL (ref 79.5–101.0)
MONO#: 0.7 10*3/uL (ref 0.1–0.9)
MONO%: 6 % (ref 0.0–14.0)
NEUT#: 8.2 10*3/uL — ABNORMAL HIGH (ref 1.5–6.5)
NEUT%: 73.4 % (ref 38.4–76.8)
Platelets: 222 10*3/uL (ref 145–400)
RBC: 4.1 10*6/uL (ref 3.70–5.45)
RDW: 13.2 % (ref 11.2–14.5)
WBC: 11.2 10*3/uL — ABNORMAL HIGH (ref 3.9–10.3)

## 2013-08-11 NOTE — Progress Notes (Signed)
ID: Rhonda Bryant OB: August 12, 1924  MR#: [id number]  TLX#:[id number]  PCP: [name], MD GYN:   SU: Excell Seltzer OTHER MD: Crissie Reese  CHIEF COMPLAINT: "I have another breast cancer"  BREAST CANER HISTORY: Patch herself noted a change in her right breast, and eventually brought it to the attention of Dr. Brigitte Pulse, who set her up for right mammography and ultrasonography at the breast center 03/02/2013. Implant displaced views could not be obtained and no definite mass could be identified on the limited mammographic figures. However on physical exam there was a firm mass palpable at the 2:00 position of the right breast ultrasound showed this to measure 2.3 cm to be lobular and hypoechoic. There was no right axillary lymph adenopathy identified.  Biopsy of this mass 03/05/2013 showed ([id number]) and invasive ductal carcinoma, grade 1 or 2, estrogen receptor 100% positive with strong staining intensity, progesterone receptor and HER-2 negative with an MIB-1 of 20%.  On 04/03/2013 the patient underwent right total mastectomy and sentinel lymph node biopsy with total capsulotomy and removal of all implant material. The pathology from this procedure ([id number]) confirmed an invasive ductal carcinoma, grade 2, measuring 1.9 cm, with ample margins. Both sentinel lymph nodes were clear. Repeat HER-2 testing was again negative.  The patient's subsequent history is as detailed below  INTERVAL HISTORY: Rhonda Bryant returns today for follow up of her breast cancer. bSince the last visit here she started anastrozole. She is tolerating it will, with minimal hot flashes as the only side effect. She is not sure of the cost to her, but will check.  REVIEW OF SYSTEMS: She has some chronic issues including double vision, hearing loss, dry cough and stress incontinence. She has back and joint pain which is not more intense or persistent than before. She is anxious and still grieving the loss of bothe her sons.A  detailed review of systems today was otherwise noncontributory  PAST MEDICAL HISTORY: Past Medical History  Diagnosis Date  . Chronic airway obstruction, not elsewhere classified   . Allergic rhinitis, cause unspecified   . Unspecified essential hypertension   . Osteoporosis   . Hypertension   . Arthritis   . Blood transfusion without reported diagnosis   . Cancer   . Heart murmur   . PONV (postoperative nausea and vomiting)   . Depression   . Anxiety   . Shortness of breath     only with lots of activity  . Pneumonia     had twice    PAST SURGICAL HISTORY: Past Surgical History  Procedure Laterality Date  . Mastectomy      left  . Cataract extraction    . Tonsillectomy    . Tonsillectomy    . Colonoscopy    . Simple mastectomy with axillary sentinel node biopsy Right 04/03/2013    Procedure: TOTAL MASTECTOMY WITH AXILLARY SENTINEL NODE BIOPSY;  Surgeon: Edward Jolly, MD;  Location: [facility];  Service: General;  Laterality: Right;  . Capsulectomy Right 04/03/2013    Procedure: TOTAL CAPSULECTOMY/REMOVAL OF IMPLANT MATERIAL RIGHT BREAST;  Surgeon: Crissie Reese, MD;  Location: [facility];  Service: Plastics;  Laterality: Right;    FAMILY HISTORY Family History  Problem Relation Age of Onset  . Pancreatic cancer Father   . Cancer Father     pancreatic  . Breast cancer Mother   . Cancer Mother     breast   the patient's father died at the age of 53. He had pancreatic cancer. The patient's  mother died at the age of 37. She had been diagnosed with breast cancer in her late 75s. The patient has one brother, 2 sisters. There is no history of ovarian cancer in the family except possibly for the patient's maternal grandmother, who died with what seems to have been some cancer involving the abdomen.  GYNECOLOGIC HISTORY:  Menarche age 67, first live birth age 23, the patient is [place] P3. She went through the change of life in her late 53s. She did not take hormone  replacement.  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 Her husband H. "[place] Chaquana Nichols. used to work in Personal assistant, now works 2 hours a week at [employer] The patient's son Remo Lipps was a marine and took his own life in the setting of what sounds like severe posttraumatic stress syndrome. Son Dominica Severin took his own life October of 2013. The patient is still grieving those deaths. The patient's daughter Gershon Mussel lives in [place]. The patient has 2 grandchildren and 2 great-grandchildren. She at tends [place] DIRECTIVES:    HEALTH MAINTENANCE: History  Substance Use Topics  . Smoking status: Former Research scientist (life sciences)  . Smokeless tobacco: Never Used  . Alcohol Use: Yes     Comment: rarely     Colonoscopy:   PAP:  Bone density: Osteoporosis, per patient report  Lipid panel:  Allergies  Allergen Reactions  . Codeine     REACTION: nausea  . Nsaids     Pt states to avoid this med  . Other     Ragweed and Dust  . Sulfamethoxazole-Trimethoprim     REACTION: nausea    Current Outpatient Prescriptions  Medication Sig Dispense Refill  . acetaminophen (TYLENOL) 500 MG tablet Take 500 mg by mouth every 6 (six) hours as needed for pain.       Marland Kitchen albuterol (PROVENTIL HFA;VENTOLIN HFA) 108 (90 BASE) MCG/ACT inhaler Inhale 2 puffs into the lungs every 6 (six) hours as needed for wheezing or shortness of breath.      . ALPRAZolam (XANAX) 0.25 MG tablet Take 0.25 mg by mouth 2 (two) times daily as needed for sleep or anxiety.       Marland Kitchen amLODipine (NORVASC) 10 MG tablet Take 5 mg by mouth daily.      Marland Kitchen anastrozole (ARIMIDEX) 1 MG tablet Take 1 tablet (1 mg total) by mouth daily.  90 tablet  4  . calcium carbonate (TUMS EX) 750 MG chewable tablet Chew 1 tablet by mouth 2 (two) times daily as needed for heartburn.      . Calcium-Vitamin D 600-125 MG-UNIT TABS Take 1 tablet by mouth 2 (two) times daily.        .  Fluticasone-Salmeterol (ADVAIR DISKUS) 100-50 MCG/DOSE AEPB Inhale 1 puff into the lungs 2 (two) times daily. Rinse mouth  60 each  11  . loratadine (CLARITIN) 10 MG tablet Take 10 mg by mouth daily.      . mometasone (NASONEX) 50 MCG/ACT nasal spray Place 2 sprays into the nose daily as needed (for congestion/allergies).      . Multiple Vitamin (MULTIVITAMIN) capsule Take 1 capsule by mouth daily.        Marland Kitchen MYRBETRIQ 25 MG TB24 Take 25 mg by mouth daily.       Marland Kitchen UNABLE TO FIND Rx: S0630- Non-Silicone Breast Prosthesis (Quantity: 1) Z6010- Silicone Breast Prosthesis (Quantity: 1) Dx: 174.9; Right Mastectomy  1 each  0   No current facility-administered medications for this visit.    OBJECTIVE: Older white woman who appears stated age  26 Vitals:   08/11/13 1354  BP: 170/66  Pulse: 86  Temp: 97.9 F (36.6 C)  Resp: 18     Body mass index is 19.54 kg/(m^2).    ECOG FS:1 - Symptomatic but completely ambulatory  Sclerae unicteric, R exotropia as before Oropharynx clear and moist, teeth in good repair No cervical or supraclavicular adenopathy Lungs no rales or rhonchi Heart regular rate and rhythm Abd soft, nontender, positive bowel sounds MSK no focal spinal tenderness, no upper extremity lymphedema Neuro: nonfocal, well oriented, appropriate affect Breasts: the right breast is unremarkable. The left breast is s/p mastectomy. There is no evidence of local recurrence. The left axilla is benign.    LAB RESULTS:  CMP     Component Value Date/Time   NA 144 08/11/2013 1327   NA 139 04/04/2013 0343   K 3.6 08/11/2013 1327   K 3.8 04/04/2013 0343   CL 104 04/04/2013 0343   CO2 31* 08/11/2013 1327   CO2 26 04/04/2013 0343   GLUCOSE 101 08/11/2013 1327   GLUCOSE 135* 04/04/2013 0343   BUN 13.4 08/11/2013 1327   BUN 13 04/04/2013 0343   CREATININE 0.8 08/11/2013 1327   CREATININE 0.77 04/04/2013 0343   CALCIUM 9.7 08/11/2013 1327   CALCIUM 8.4 04/04/2013 0343   PROT 7.2 08/11/2013 1327    ALBUMIN 3.8 08/11/2013 1327   AST 18 08/11/2013 1327   ALT 13 08/11/2013 1327   ALKPHOS 76 08/11/2013 1327   BILITOT 0.33 08/11/2013 1327   GFRNONAA 74* 04/04/2013 0343   GFRAA 86* 04/04/2013 0343    I No results found for this basename: SPEP,  UPEP,   kappa and lambda light chains    Lab Results  Component Value Date   WBC 11.2* 08/11/2013   NEUTROABS 8.2* 08/11/2013   HGB 12.3 08/11/2013   HCT 37.7 08/11/2013   MCV 91.9 08/11/2013   PLT 222 08/11/2013      Chemistry      Component Value Date/Time   NA 144 08/11/2013 1327   NA 139 04/04/2013 0343   K 3.6 08/11/2013 1327   K 3.8 04/04/2013 0343   CL 104 04/04/2013 0343   CO2 31* 08/11/2013 1327   CO2 26 04/04/2013 0343   BUN 13.4 08/11/2013 1327   BUN 13 04/04/2013 0343   CREATININE 0.8 08/11/2013 1327   CREATININE 0.77 04/04/2013 0343      Component Value Date/Time   CALCIUM 9.7 08/11/2013 1327   CALCIUM 8.4 04/04/2013 0343   ALKPHOS 76 08/11/2013 1327   AST 18 08/11/2013 1327   ALT 13 08/11/2013 1327   BILITOT 0.33 08/11/2013 1327       No results found for this basename: LABCA2    No components found with this basename: LABCA125    No results found for this basename: INR,  in the last 168 hours  Urinalysis    Component Value Date/Time   COLORURINE YELLOW 12/15/2010 2158   APPEARANCEUR CLOUDY* 12/15/2010 2158   LABSPEC 1.015 12/15/2010 2158   PHURINE 7.5 12/15/2010 2158   GLUCOSEU NEGATIVE 12/15/2010 2158   HGBUR NEGATIVE 12/15/2010 2158   BILIRUBINUR NEGATIVE 12/15/2010 2158   KETONESUR NEGATIVE 12/15/2010 2158   PROTEINUR NEGATIVE 12/15/2010 2158   UROBILINOGEN 0.2 12/15/2010 2158   NITRITE NEGATIVE 12/15/2010 2158   LEUKOCYTESUR NEGATIVE 12/15/2010 2158    STUDIES: Mammographic studies reviewed  ASSESSMENT: 80 y.o. [place] woman  (1) status post left modified radical mastectomy in 1989; receive tamoxifen adjuvantly for 10 years.  (2) status post right mastectomy and sentinel lymph node sampling 04/03/2013 for a pT2 pN0, stage IIA invasive  ductal carcinoma, grade 2, estrogen receptor 100% positive, progesterone receptor negative, with an MIB-1 of 20%, and no HER-2 amplification.  (3) anastrozole started 05/22/2013  (4) denosumab to start 08/17/2013, to be repeated Q 6 months while onanastrozole  PLAN: Rhonda Bryant is tolerating the anastrozole well. Accordingly the plan will be to continue that medication for 5 years. She is aware of the fact that it can worsen her osteoporosis so today we discussed ibandronate, zolendronate and denosumab for osteoporosis treatment. She ahs a good understanding of the possible toxicities, side effects and complications of these agents including the risk of osteonecrosis of the jaw (which she discussed with her dentist) and hypocalcemia.  After much dioscussion she decided to try the denosumab. First dfose will be 08/17/2013. I have hadvised her to start TUMS the day before and continue for several days afterwards. If she develops any symptoms of low calcium she will call.  Otherwise she will see [place] again in 6 months, with her.next dose of denosumab. She knows to call for any problems that may develop before that visit  Chauncey Cruel, MD   08/11/2013 2:16 PM

## 2013-08-12 ENCOUNTER — Telehealth: Payer: Self-pay | Admitting: Oncology

## 2013-08-12 LAB — VITAMIN D 25 HYDROXY (VIT D DEFICIENCY, FRACTURES): VIT D 25 HYDROXY: 42 ng/mL (ref 30–89)

## 2013-08-12 NOTE — Telephone Encounter (Signed)
, °

## 2013-08-17 ENCOUNTER — Ambulatory Visit (HOSPITAL_BASED_OUTPATIENT_CLINIC_OR_DEPARTMENT_OTHER): Payer: Medicare Other

## 2013-08-17 VITALS — BP 142/53 | HR 90 | Temp 98.0°F

## 2013-08-17 DIAGNOSIS — C50219 Malignant neoplasm of upper-inner quadrant of unspecified female breast: Secondary | ICD-10-CM

## 2013-08-17 DIAGNOSIS — C50211 Malignant neoplasm of upper-inner quadrant of right female breast: Secondary | ICD-10-CM

## 2013-08-17 DIAGNOSIS — M810 Age-related osteoporosis without current pathological fracture: Secondary | ICD-10-CM

## 2013-08-17 MED ORDER — DENOSUMAB 60 MG/ML ~~LOC~~ SOLN
60.0000 mg | Freq: Once | SUBCUTANEOUS | Status: AC
  Administered 2013-08-17: 60 mg via SUBCUTANEOUS
  Filled 2013-08-17: qty 1

## 2013-08-17 NOTE — Patient Instructions (Signed)
Denosumab injection  What is this medicine?  DENOSUMAB (den oh sue mab) slows bone breakdown. Prolia is used to treat osteoporosis in women after menopause and in men. Xgeva is used to prevent bone fractures and other bone problems caused by cancer bone metastases. Xgeva is also used to treat giant cell tumor of the bone.  This medicine may be used for other purposes; ask your health care provider or pharmacist if you have questions.  COMMON BRAND NAME(S): Prolia, XGEVA  What should I tell my health care provider before I take this medicine?  They need to know if you have any of these conditions:  -dental disease  -eczema  -infection or history of infections  -kidney disease or on dialysis  -low blood calcium or vitamin D  -malabsorption syndrome  -scheduled to have surgery or tooth extraction  -taking medicine that contains denosumab  -thyroid or parathyroid disease  -an unusual reaction to denosumab, other medicines, foods, dyes, or preservatives  -pregnant or trying to get pregnant  -breast-feeding  How should I use this medicine?  This medicine is for injection under the skin. It is given by a health care professional in a hospital or clinic setting.  If you are getting Prolia, a special MedGuide will be given to you by the pharmacist with each prescription and refill. Be sure to read this information carefully each time.  For Prolia, talk to your pediatrician regarding the use of this medicine in children. Special care may be needed. For Xgeva, talk to your pediatrician regarding the use of this medicine in children. While this drug may be prescribed for children as young as 13 years for selected conditions, precautions do apply.  Overdosage: If you think you've taken too much of this medicine contact a poison control center or emergency room at once.  Overdosage: If you think you have taken too much of this medicine contact a poison control center or emergency room at once.  NOTE: This medicine is only for  you. Do not share this medicine with others.  What if I miss a dose?  It is important not to miss your dose. Call your doctor or health care professional if you are unable to keep an appointment.  What may interact with this medicine?  Do not take this medicine with any of the following medications:  -other medicines containing denosumab  This medicine may also interact with the following medications:  -medicines that suppress the immune system  -medicines that treat cancer  -steroid medicines like prednisone or cortisone  This list may not describe all possible interactions. Give your health care provider a list of all the medicines, herbs, non-prescription drugs, or dietary supplements you use. Also tell them if you smoke, drink alcohol, or use illegal drugs. Some items may interact with your medicine.  What should I watch for while using this medicine?  Visit your doctor or health care professional for regular checks on your progress. Your doctor or health care professional may order blood tests and other tests to see how you are doing.  Call your doctor or health care professional if you get a cold or other infection while receiving this medicine. Do not treat yourself. This medicine may decrease your body's ability to fight infection.  You should make sure you get enough calcium and vitamin D while you are taking this medicine, unless your doctor tells you not to. Discuss the foods you eat and the vitamins you take with your health care professional.    See your dentist regularly. Brush and floss your teeth as directed. Before you have any dental work done, tell your dentist you are receiving this medicine.  Do not become pregnant while taking this medicine or for 5 months after stopping it. Women should inform their doctor if they wish to become pregnant or think they might be pregnant. There is a potential for serious side effects to an unborn child. Talk to your health care professional or pharmacist for more  information.  What side effects may I notice from receiving this medicine?  Side effects that you should report to your doctor or health care professional as soon as possible:  -allergic reactions like skin rash, itching or hives, swelling of the face, lips, or tongue  -breathing problems  -chest pain  -fast, irregular heartbeat  -feeling faint or lightheaded, falls  -fever, chills, or any other sign of infection  -muscle spasms, tightening, or twitches  -numbness or tingling  -skin blisters or bumps, or is dry, peels, or red  -slow healing or unexplained pain in the mouth or jaw  -unusual bleeding or bruising  Side effects that usually do not require medical attention (Report these to your doctor or health care professional if they continue or are bothersome.):  -muscle pain  -stomach upset, gas  This list may not describe all possible side effects. Call your doctor for medical advice about side effects. You may report side effects to FDA at 1-800-FDA-1088.  Where should I keep my medicine?  This medicine is only given in a clinic, doctor's office, or other health care setting and will not be stored at home.  NOTE: This sheet is a summary. It may not cover all possible information. If you have questions about this medicine, talk to your doctor, pharmacist, or health care provider.  © 2014, [employer]vier/[employer] Standard. (2011-11-26 12:37:47)

## 2013-09-04 ENCOUNTER — Ambulatory Visit (INDEPENDENT_AMBULATORY_CARE_PROVIDER_SITE_OTHER): Payer: Medicare Other | Admitting: Internal Medicine

## 2013-09-04 ENCOUNTER — Encounter: Payer: Self-pay | Admitting: Internal Medicine

## 2013-09-04 VITALS — BP 140/72 | HR 78 | Ht 64.0 in | Wt 114.0 lb

## 2013-09-04 DIAGNOSIS — J45909 Unspecified asthma, uncomplicated: Secondary | ICD-10-CM

## 2013-09-04 DIAGNOSIS — J301 Allergic rhinitis due to pollen: Secondary | ICD-10-CM

## 2013-09-04 DIAGNOSIS — J45998 Other asthma: Secondary | ICD-10-CM

## 2013-09-04 NOTE — Patient Instructions (Signed)
We can stop the allergy shots now and see how you do using the nasonex nasal spray and otc antihistamine when needed.  We can continue the Advair and albuterol rescue inhaler as before  Please call as needed

## 2013-09-04 NOTE — Progress Notes (Signed)
Patient ID: Rhonda Bryant, female    DOB: 10-Jun-1925, 80 y.o.   MRN: [id number]  HPI 01/09/11- 80 yoF followed for COPD, Allergic rhinitis   Dr Serita Grammes Last here 05/22/2010- note reviewed [facility] 7/6-12/17/10 in June with bronchopneumonia per CT 12/16/10, treated Avelox. She had felt some fever and chills, coughing scant dark sputum.   Still feels washed out and anorectic. Additional stress as she has moved from hose to apartment. Husband is helping. Denies reflux and had not been sick acutely prior to this. Had felt tired, and weight had gone down some in preceding months. Remains unusually anxious. No hx thyroid or DM Chest now feels normal on her routine meds. Still a little dry cough. Has had pneumovax more than once.   07/10/11- 80 yoF followed for COPD, Allergic rhinitis    Had flu vaccine. Used a Z-Pak while getting over a chest cold in early December but otherwise has done well with no acute respiratory illness. Today she feels "fine". Says she is doing well with allergy vaccine. Using a saline nasal gel which reduces epistaxis. Using Advair once daily and almost never needs her rescue inhaler.  01/08/12- 80 yoF followed for COPD, Allergic rhinitis  :"doing good" denies any wheezing, cough, congestion, or  SOB COPD assessment test (CAT) 6/40 She continues allergy vaccine without problems at 1:10. She expresses concern about exposure to mold or dust. 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 She denies any wheezing and admits only a little cough. CT chest 12/16/10 ( after pneumonia in June) reviewed. IMPRESSION:  No evidence of pulmonary embolism.  Patchy opacities in the right middle lobe, as well as scattered  mild opacities in the lingula and bilateral lower lobes, possibly  reflecting a very mild pneumonia or bronchiolitis.  Original Report Authenticated By: Julian Hy, M.D.   07/31/12- 80 yoF former smoker followed for COPD, Allergic rhinitis  FOLLOWS FOR: still on allergy vaccine 1:10 GH and no reactions, having sneezing episodes Viral bronchitis syndrome in January resolved. Occasional sneezing and blowing. She does have Astelin and Nasonex nasal sprays. She feels well enough controlled. We discussed chest CT from July, 2012 and agreed to update chest x-ray.  02/02/13- 80 yoF former smoker followed for COPD, Allergic rhinitis  FOLLOWS FOR: still on  Allergy vaccine 1:10 GH and doing well; has had dry cough for several months however. Dry cough since spring with throat tickle. Still using Advair one time daily. Little postnasal drip or reflux. CT 08/12/12 IMPRESSION:  1. No acute cardiopulmonary abnormalities.  2. Similar appearance of the bilateral upper lobe predominant  parenchymal scarring and pleural thickening with calcifications.  3. Stable 3 mm nodule in the left upper lobe. This is most likely  benign.  Original Report Authenticated By: Kerby Moors, M.D.  09/04/13- 80 yoF former smoker followed for COPD, Allergic rhinitis, complicated by hx breast Ca FOLLOWS FOR: Pt c/o runny nose, some sinus congestion, nonprod cough.  Pt has not been taking allergy injections regularly. Had right mastectomy last October with no radiation. Remote left mastectomy.  ROS-see HPI Constitutional:   No-   weight loss, night sweats, fevers, chills, fatigue, lassitude. HEENT:   No-  headaches, difficulty swallowing, tooth/dental problems, sore throat,       No-  sneezing, itching, ear ache, nasal congestion, +post nasal drip,  CV:  No-   chest pain, orthopnea, PND, swelling in lower extremities, anasarca, dizziness, palpitations Resp: No-   shortness of breath with exertion or at rest.              No-   productive cough,  +non-productive cough,  No- coughing up of blood.              No-   change in color of mucus.  No- wheezing.   Skin: No-   rash or  lesions. GI:  No-   heartburn, indigestion, abdominal pain, nausea, vomiting,  GU:  MS:  No-   joint pain or swelling.   Neuro-     nothing unusual Psych:  No- change in mood or affect. No depression or anxiety.  No memory loss.  Objective:   Physical Exam General- Alert, Oriented, Affect-appropriate, Distress- none acute,   thin Skin- rash-none, lesions- none, excoriation- none Lymphadenopathy- none Head- atraumatic            Eyes- Gross vision intact, PERRLA, strabismus, conjunctivae clear secretions            Ears- Hearing, canals normal            Nose- Clear, no-Septal dev, mucus, polyps, erosion, perforation             Throat- Mallampati II , mucosa clear , drainage- none, tonsils- atrophic Neck- flexible , trachea midline, no stridor , thyroid nl, carotid no bruit Chest - symmetrical excursion , unlabored           Heart/CV- RRR , no murmur , no gallop  , no rub, nl s1 s2                           - JVD- none , edema- none, stasis changes- none, varices- none           Lung- clear to P&A, + distant , wheeze- none, cough- none , dullness-none, rub- none           Chest wall- bilateral mastectomy Abd- Br/ Gen/ Rectal- Not done, not indicated Extrem- cyanosis- none, clubbing, none, atrophy- none, strength- nl Neuro- grossly intact to observation

## 2013-09-21 ENCOUNTER — Telehealth: Payer: Self-pay | Admitting: *Deleted

## 2013-09-21 NOTE — Telephone Encounter (Signed)
Pt not available to come to the phone so I left a message w/ her husband to have her to call me tomorrow to reschedule her genetic appt.

## 2013-09-23 ENCOUNTER — Telehealth: Payer: Self-pay | Admitting: *Deleted

## 2013-09-23 NOTE — Telephone Encounter (Signed)
Pt returned my call and I informed her that Rhonda Bryant is no longer here and I need to reschedule her genetic appt.  Confirmed 10/28/13 genetic appt w/ pt.

## 2013-10-02 NOTE — Assessment & Plan Note (Signed)
Plan-stop allergy vaccine. Antihistamines if needed

## 2013-10-02 NOTE — Assessment & Plan Note (Signed)
Follow conservatively

## 2013-10-13 ENCOUNTER — Other Ambulatory Visit: Payer: Self-pay | Admitting: Internal Medicine

## 2013-10-19 ENCOUNTER — Encounter: Payer: Medicare Other | Admitting: Genetic Counselor

## 2013-10-19 ENCOUNTER — Other Ambulatory Visit: Payer: Medicare Other

## 2013-10-28 ENCOUNTER — Ambulatory Visit (HOSPITAL_BASED_OUTPATIENT_CLINIC_OR_DEPARTMENT_OTHER): Payer: Medicare Other | Admitting: Genetic Counselor

## 2013-10-28 ENCOUNTER — Other Ambulatory Visit: Payer: Medicare Other

## 2013-10-28 ENCOUNTER — Encounter: Payer: Self-pay | Admitting: Genetic Counselor

## 2013-10-28 DIAGNOSIS — IMO0002 Reserved for concepts with insufficient information to code with codable children: Secondary | ICD-10-CM

## 2013-10-28 DIAGNOSIS — Z853 Personal history of malignant neoplasm of breast: Secondary | ICD-10-CM

## 2013-10-28 DIAGNOSIS — C50211 Malignant neoplasm of upper-inner quadrant of right female breast: Secondary | ICD-10-CM

## 2013-10-28 DIAGNOSIS — J301 Allergic rhinitis due to pollen: Secondary | ICD-10-CM

## 2013-10-28 DIAGNOSIS — C50919 Malignant neoplasm of unspecified site of unspecified female breast: Secondary | ICD-10-CM | POA: Insufficient documentation

## 2013-10-28 DIAGNOSIS — M810 Age-related osteoporosis without current pathological fracture: Secondary | ICD-10-CM

## 2013-10-28 DIAGNOSIS — Z803 Family history of malignant neoplasm of breast: Secondary | ICD-10-CM | POA: Insufficient documentation

## 2013-10-28 NOTE — Progress Notes (Signed)
Patient Name: Rhonda Bryant Patient Age: 80 y.o. Encounter Date: 10/28/2013  Referring Physician: [name], MD  Primary Care Provider: [name], MD   Ms. Rhonda Bryant, a 80 y.o. female, is being seen at the [facility] due to a personal and family history of breast cancer.  She presents to clinic today to discuss the possibility of a hereditary predisposition to cancer and discuss whether genetic testing is warranted.  HISTORY OF PRESENT ILLNESS: Rhonda Bryant was initially diagnosed with left breast cancer in 1989 at the age of 46. She had a left mastectomy and took Tamoxifen for 10 years. In 02/2013, at the age of 16, she was diagnosed with right breast cancer (IDC) and is s/p right mastectomy. The breast tumor was ER positive, PR negative, and HER2 negative.  Rhonda Bryant reports a history of non-melanoma skin cancer on her nose and the back of her neck. Otherwise, she has no other history of cancer. She reports having had 2 colonoscopic screenings with a total of ~4 polyps removed in her lifetime.  ` Past Surgical History  Procedure Laterality Date  . Mastectomy      left  . Cataract extraction    . Tonsillectomy    . Tonsillectomy    . Colonoscopy    . Simple mastectomy with axillary sentinel node biopsy Right 04/03/2013    Procedure: TOTAL MASTECTOMY WITH AXILLARY SENTINEL NODE BIOPSY;  Surgeon: Edward Jolly, MD;  Location: [facility];  Service: General;  Laterality: Right;  . Capsulectomy Right 04/03/2013    Procedure: TOTAL CAPSULECTOMY/REMOVAL OF IMPLANT MATERIAL RIGHT BREAST;  Surgeon: Crissie Reese, MD;  Location: [facility];  Service: Plastics;  Laterality: Right;    History   Social History  . Marital Status: Married    Spouse Name: N/A    Number of Children: N/A  . Years of Education: N/A   Social History Main Topics  . Smoking status: Former Research scientist (life sciences)  . Smokeless tobacco: Never Used  . Alcohol Use: Yes     Comment: rarely  . Drug Use:  No  . Sexual Activity: Not on file   Other Topics Concern  . Not on file   Social History Narrative  . No narrative on file     FAMILY HISTORY:   During the visit, a 4-generation pedigree was obtained. Significant diagnoses include the following:  Family History  Problem Relation Age of Onset  . Pancreatic cancer Father     deceased 73  . Breast cancer Mother 47    deceased 53    Additionally, Rhonda Bryant's daughter is 59 and cancer-free. Her sister (age 3) and brother (age 24) are both cancer-free. She has 6 nieces and 2 nephews who are all cancer-free.  Rhonda Bryant's ancestry is Caucasian - NOS. There is no known Jewish ancestry and no consanguinity.  ASSESSMENT AND PLAN: Rhonda Bryant is a 80 y.o. female with a personal history of bilateral breast cancers (diagnosed at 74 and 25) and family history of breast cancer in her mother at age 51. This history is somewhat suggestive of a hereditary predisposition to cancer even though she was not young at diagnosis. We reviewed the characteristics, features and inheritance patterns of hereditary cancer syndromes. We also discussed genetic testing, including the appropriate family members to test, the process of testing, insurance coverage and implications of results. Negative results will be generally reassuring, but she is aware that her close female relatives would still be at increased risk of breast cancer  due to the family history.  Rhonda Bryant wished to pursue genetic testing and a blood sample will be sent to [facility] for analysis of 17 genes on the BreastNext panel. We discussed the implications of a positive, negative and/ or Variant of Uncertain Significance (VUS) result. Results should be available in approximately 4-5 weeks, at which point we will contact her and address implications for her as well as address genetic testing for at-risk family members, if needed.    We encouraged Rhonda Bryant to remain in contact with  Cancer Genetics annually so that we can update the family history and inform her of any changes in cancer genetics and testing that may be of benefit for this family. Ms.  Pletz's questions were answered to her satisfaction today.   Thank you for the referral and allowing Rhonda Bryant to share in the care of your patient.   The patient was seen for a total of 30 minutes, greater than 50% of which was spent face-to-face counseling. This patient was discussed with the referring provider who agrees with the above.

## 2013-11-30 ENCOUNTER — Encounter: Payer: Self-pay | Admitting: Genetic Counselor

## 2013-11-30 DIAGNOSIS — Z1509 Genetic susceptibility to other malignant neoplasm: Secondary | ICD-10-CM

## 2013-11-30 DIAGNOSIS — Z1501 Genetic susceptibility to malignant neoplasm of breast: Secondary | ICD-10-CM | POA: Insufficient documentation

## 2013-11-30 NOTE — Progress Notes (Signed)
Referring Provider: [name], MD    Ms. Rivere was seen in the [facility] on 10/28/13 due to a personal history of bilateral breast cancer and concern regarding a hereditary predisposition to cancer in the family. Please refer to the prior Genetics clinic note for more information regarding Ms. Nickerson's medical and family histories and our assessment at the time.   GENETIC TESTING: At the time of Ms. Mellette's visit, we recommended she pursue genetic testing of the 17 genes on the BreastNext panel. This test, which included sequencing and deletion/duplication analysis, was performed at [facility]. Testing revealed a mutation in the BRCA2 gene called D.H7897* (c.7558C>T). This mutation is also known as 7786C>T. This mutation explains why Ms. Szafran had bilateral breast cancer.  MEDICAL MANAGEMENT: Women who have a BRCA mutation have an increased risk for both breast and ovarian cancer. Since Ms. Jahr  has already had bilateral mastectomies, there are no additional breast imaging studies that are recommended. We discussed the risk of ovarian cancer and that there are no effective screenings for it. She should discuss her options for risk-reduction with her physicians.   FAMILY MEMBERS: It is important that all of Ms. Bitting's relatives (both men and women) know of the presence of this gene mutation. Site-specific genetic testing can sort out who in the family is at risk and who is not. Ms. Ferrante's daughter and siblings have a 50% chance to have inherited this mutation. We recommend they have genetic testing for this same mutation, as identifying the presence of this mutation would allow them to also take advantage of risk-reducing measures. She was instructed to share her results with all blood relatives.  SUPPORT AND RESOURCES: If Ms. Seib is interested in BRCA-specific information and support, there are two groups, Facing Our Risk (www.facingourrisk.com) and Bright Pink  (www.brightpink.org) which some people have found useful. They provide opportunities to speak with other individuals from high-risk families. To locate genetic counselors in other cities, visit the website of the [employer] (ArtistMovie.se) and Secretary/administrator for a Social worker by zip code.  We encouraged Ms. Remus to remain in contact with [place] on an annual basis so we can update her personal and family histories, and let her know of advances in cancer genetics that may benefit the family. Our contact number was provided. Ms. Moffatt's questions were answered to her satisfaction today, and she knows she is welcome to call anytime with additional questions.    Steele Berg, MS, [place] Certified Genetic Counseor phone: [phone] [email or web address]

## 2013-12-13 ENCOUNTER — Other Ambulatory Visit: Payer: Self-pay | Admitting: Oncology

## 2013-12-13 DIAGNOSIS — C50919 Malignant neoplasm of unspecified site of unspecified female breast: Secondary | ICD-10-CM

## 2013-12-13 NOTE — Progress Notes (Unsigned)
Testing revealed a mutation in the BRCA2 gene called E.X5170* (c.7558C>T). This mutation is also known as 7786C>T. This mutation explains why Rhonda Bryant had bilateral breast cancer.   She has had bilateral mastectomies, but not BSO. I called and discusseedd with patient. Will refer her to dr Toney Rakes for BSO.

## 2013-12-30 ENCOUNTER — Telehealth: Payer: Self-pay

## 2013-12-30 NOTE — Telephone Encounter (Signed)
Pt left msg requesting results of her "cancer report".  Routed to [facility] and [facility].

## 2014-01-04 ENCOUNTER — Other Ambulatory Visit: Payer: Self-pay | Admitting: *Deleted

## 2014-01-04 NOTE — Progress Notes (Signed)
Pt called to this RN to state she has not received a call from GYN office Dr Jannifer Rodney referred her to.  This RN reviewed chart and noted per EPIC referral to Dr Uvaldo Rising.  This RN called his office and inquired per appointment- referral was pulled from [facility] and this RN informed pt would be contacted this week.  This RN called pt and informed her of above- name and phone number of above MD given to pt.

## 2014-01-07 ENCOUNTER — Other Ambulatory Visit: Payer: Self-pay | Admitting: *Deleted

## 2014-01-07 DIAGNOSIS — Z1509 Genetic susceptibility to other malignant neoplasm: Principal | ICD-10-CM

## 2014-01-07 DIAGNOSIS — Z1501 Genetic susceptibility to malignant neoplasm of breast: Secondary | ICD-10-CM

## 2014-01-07 NOTE — Progress Notes (Signed)
This RN received call from Butch Penny at [place] office per referral for pt to Dr Toney Rakes.  Butch Penny states referral not " dropping into work que because not entered correctly "  Per EPIC can view the placement of the referral only- ( therefore reason pt was not contacted previously ).  This RN with direction given by Butch Penny re-entered referral appropriately to be available to obtain via "work que"  Of note- Butch Penny states pt has been contacted x 2 with message left on VM regarding scheduling pt for an appointment.

## 2014-01-13 ENCOUNTER — Encounter: Payer: Self-pay | Admitting: Gynecology

## 2014-01-13 ENCOUNTER — Telehealth: Payer: Self-pay

## 2014-01-13 ENCOUNTER — Ambulatory Visit (INDEPENDENT_AMBULATORY_CARE_PROVIDER_SITE_OTHER): Payer: Medicare Other | Admitting: Gynecology

## 2014-01-13 VITALS — BP 136/88 | Ht 64.0 in | Wt 115.0 lb

## 2014-01-13 DIAGNOSIS — Z1501 Genetic susceptibility to malignant neoplasm of breast: Secondary | ICD-10-CM

## 2014-01-13 DIAGNOSIS — Z01818 Encounter for other preprocedural examination: Secondary | ICD-10-CM

## 2014-01-13 DIAGNOSIS — Z853 Personal history of malignant neoplasm of breast: Secondary | ICD-10-CM

## 2014-01-13 DIAGNOSIS — Z1509 Genetic susceptibility to other malignant neoplasm: Secondary | ICD-10-CM

## 2014-01-13 NOTE — Telephone Encounter (Signed)
I left message with surgery date on her home voice mail and asked her to call me to confirm it.

## 2014-01-13 NOTE — Progress Notes (Addendum)
Rhonda Bryant is an 80 y.o. female. Refer to her practice as a courtesy of Dr. Jana Hakim Medical Oncologist who has been following the patient as a result of her history of breast cancer as follows:  1989 left modified radical mastectomy secondary to breast cancer received tamoxifen for 10 years 2014 right mastectomy and sentinel lymph node sampling for a pT2 pN0, stage IIA invasive ductal carcinoma, grade 2, estrogen receptor 100% positive, progesterone receptor negative, with an MIB-1 of 20%, and no HER-2 amplification.  Patient was started on Anastrzole in 2014 and was started on Prolia in 2015 as a result of her osteoporosis.  Patient had genetic testing as a result of her breast cancer as well as her mother having had breast cancer. The testing demonstrated the following: Testing revealed a mutation in the BRCA2 gene called p.R2520* (c.7558C>T)  Because of this mutation it was  recommended patient undergo prophylactic bilateral salpingo-oophorectomy to minimize her high-risk of developing ovarian cancer in the near future. Patient denies any prior history of hormone replacement therapy.   Pertinent Gynecological History: Menses: post-menopausal Bleeding: none Contraception: post menopausal status DES exposure: denies Blood transfusions: post partum hemorrhage  Sexually transmitted diseases: no past history Previous GYN Procedures: none  Last mammogram: normal Date: see above Last pap: normal Date: over 15 years OB History: G3, P3   Menstrual History: Menarche age:  80  No LMP recorded. Patient is postmenopausal.    Past Medical History  Diagnosis Date  . Chronic airway obstruction, not elsewhere classified   . Allergic rhinitis, cause unspecified   . Unspecified essential hypertension   . Osteoporosis   . Hypertension   . Arthritis   . Blood transfusion without reported diagnosis   . Cancer   . Heart murmur   . PONV (postoperative nausea and vomiting)   .  Depression   . Anxiety   . Shortness of breath     only with lots of activity  . Pneumonia     had twice  . Malignant neoplasm of breast (female), unspecified site     bilateral breast cancer ages 67 and 64  . Family history of malignant neoplasm of breast   . BRCA2 positive     p.R2520* (c.7558C>T) also known as 7786C>T  . BRCA2 positive   . COPD (chronic obstructive pulmonary disease)     Past Surgical History  Procedure Laterality Date  . Mastectomy      left  . Cataract extraction    . Tonsillectomy    . Tonsillectomy    . Colonoscopy    . Simple mastectomy with axillary sentinel node biopsy Right 04/03/2013    Procedure: TOTAL MASTECTOMY WITH AXILLARY SENTINEL NODE BIOPSY;  Surgeon: Edward Jolly, MD;  Location: [facility] [facility];  Service: General;  Laterality: Right;  . Capsulectomy Right 04/03/2013    Procedure: TOTAL CAPSULECTOMY/REMOVAL OF IMPLANT MATERIAL RIGHT BREAST;  Surgeon: Crissie Reese, MD;  Location: [facility];  Service: Plastics;  Laterality: Right;    Family History  Problem Relation Age of Onset  . Pancreatic cancer Father     deceased 27  . Breast cancer Mother 26    deceased 16    Social History:  reports that she has quit smoking. She has never used smokeless tobacco. She reports that she drinks alcohol. She reports that she does not use illicit drugs.  Allergies:  Allergies  Allergen Reactions  . Codeine     REACTION: nausea  . Nsaids  Pt states to avoid this med  . Other     Ragweed and Dust  . Sulfamethoxazole-Trimethoprim     REACTION: nausea     (Not in a hospital admission)  REVIEW OF SYSTEMS: A ROS was performed and pertinent positives and negatives are included in the history.  GENERAL: No fevers or chills. HEENT: No change in vision, no earache, sore throat or sinus congestion. NECK: No pain or stiffness. CARDIOVASCULAR: No chest pain or pressure. No palpitations. PULMONARY: No shortness of breath, cough or wheeze. GASTROINTESTINAL: No  abdominal pain, nausea, vomiting or diarrhea, melena or bright red blood per rectum. GENITOURINARY: No urinary frequency, urgency, hesitancy or dysuria. MUSCULOSKELETAL: No joint or muscle pain, no back pain, no recent trauma. DERMATOLOGIC: No rash, no itching, no lesions. ENDOCRINE: No polyuria, polydipsia, no heat or cold intolerance. No recent change in weight. HEMATOLOGICAL: No anemia or easy bruising or bleeding. NEUROLOGIC: No headache, seizures, numbness, tingling or weakness. PSYCHIATRIC: No depression, no loss of interest in normal activity or change in sleep pattern.     Blood pressure 136/88, height 5\' 4"  (1.626 m), weight 115 lb (52.164 kg).  Physical Exam:  HEENT:unremarkable Neck:Supple, midline, no thyroid megaly, no carotid bruits Lungs:  Clear to auscultation no rhonchi's or wheezes Heart:Regular rate and rhythm, no murmurs or gallops Breast Exam: Evidence of right mastectomy, left breast mastectomy with reconstruction Abdomen: Soft nontender no rebound guarding Pelvic:BUS atrophic changes Vagina: No lesions or discharge atrophic changes Cervix: Atrophic changes Uterus: Axial position nontender normal size Adnexa: Left adnexal fullness  Extremities: No cords, no edema Rectal: Unremarkable   Assessment/Plan: 80 year old patient with history of bilateral mastectomy as a result of breast cancer with positive BRCA2 gene mutation will be scheduled for prophylactic laparoscopic bilateral salpingo-oophorectomy. She will return to the office next week for an ultrasound for better assess the left adnexal fullness area. The risks benefits and pros and cons of the operation were discussed as follows:                        Patient was counseled as to the risk of surgery to include the following:  1. Infection (prohylactic antibiotics will be administered)  2. DVT/Pulmonary Embolism (prophylactic pneumo compression stockings will be used)  3.Trauma to internal organs requiring  additional surgical procedure to repair any injury to     Internal organs requiring perhaps additional hospitalization days.  4.Hemmorhage requiring transfusion and blood products which carry risks such as  anaphylactic reaction, hepatitis and AIDS  Patient had received literature information on the procedure scheduled and all her questions were answered and fully accepts all risk.   Oracio Galen HMD1:05 PMTD@Note : This dictation was prepared with  Dragon/digital dictation along withSmart phrase technology. Any transcriptional errors that result from this process are unintentional.      Terrance Mass 01/13/2014, 12:21 PM  Note: This dictation was prepared with  Dragon/digital dictation along withSmart phrase technology. Any transcriptional errors that result from this process are unintentional.

## 2014-01-13 NOTE — Patient Instructions (Signed)
BRCA-1 and BRCA-2 BRCA-1 and BRCA-2 are 2 genes that are linked with hereditary breast and ovarian cancers. About 200,000 women are diagnosed with invasive breast cancer each year and about 23,000 with ovarian cancer (according to the American Cancer Society). Of these cancers, about 5% to 10% will be due to a mutation in one of the BRCA genes. Men can also inherit an increased risk of developing breast cancer, primarily from an alteration in the BRCA-2 gene.  Individuals with mutations in BRCA1 or BRCA2 have significantly elevated risks for breast cancer (up to 80% lifetime risk), ovarian cancer (up to 40% lifetime risk), bilateral breast cancer and other types of cancers. BRCA mutations are inherited and passed from generation to generation. One half of the time, they are passed from the father's side of the family.  The DNA in white blood cells is used to detect mutations in the BRCA genes. While the gene products (proteins) of the BRCA genes act only in breast and ovarian tissue, the genes are present in every cell of the body and blood is the most easily accessible source of that DNA. PREPARATION FOR TEST The test for BRCA mutations is done on a blood sample collected by needle from a vein in the arm. The test does not require surgical biopsy of breast or ovarian tissue.  NORMAL FINDINGS No genetic mutations. Ranges for normal findings may vary among different laboratories and hospitals. You should always check with your doctor after having lab work or other tests done to discuss the meaning of your test results and whether your values are considered within normal limits. MEANING OF TEST  Your caregiver will go over the test results with you and discuss the importance and meaning of your results, as well as treatment options and the need for additional tests if necessary. OBTAINING THE TEST RESULTS It is your responsibility to obtain your test results. Ask the lab or department performing the test  when and how you will get your results. OTHER THINGS TO KNOW Your test results may have implications for other family members. When one member of a family is tested for BRCA mutations, issues often arise about how or whether to share this information with other family members. Seek advice from a genetic counselor about communication of result with your family members.  Pre and post test consultation with a health care provider knowledgeable about genetic testing cannot be overemphasized.  There are many issues to be considered when preparing for a genetic test and upon learning the results, and a genetic counselor has the knowledge and experience to help you sort through them.  If the BRCA test is positive, the options include increased frequency of check-ups (e.g., mammography, blood tests for CA-125, or transvaginal ultrasonography); medications that could reduce risk (e.g., oral contraceptives or tamoxifen); or surgical removal of the ovaries or breasts. There are a number of variables involved and it is important to discuss your options with your doctor and genetic counselor. Research studies have reported that for every 1000 women negative for BRCA mutations, between 12 and 45 of them will develop breast cancer by age 50 and between 3 and 4 will develop ovarian cancer by age 50. The risk increases with age. The test can be ordered by a doctor, preferably by one who can also offer genetic counseling. The blood sample will be sent to a laboratory that specializes in BRCA testing. The American Society of Clinical Oncology and the National Breast Cancer Coalition encourage women seeking the   test to participate in long-term outcome studies to help gather information on the effectiveness of different check-up and treatment options. Document Released: 06/21/2004 Document Revised: 08/20/2011 Document Reviewed: 08/28/2013 [facility] Patient Information 2015 [place], [place]. This information is not intended to  replace advice given to you by your health care provider. Make sure you discuss any questions you have with your health care provider. Diagnostic Laparoscopy Laparoscopy is a surgical procedure. It is used to diagnose and treat diseases inside the belly (abdomen). It is usually a brief, common, and relatively simple procedure. The laparoscopeis a thin, lighted, pencil-sized instrument. It is like a telescope. It is inserted into your abdomen through a small cut (incision). Your caregiver can look at the organs inside your body through this instrument. He or she can see if there is anything abnormal. Laparoscopy can be done either in a hospital or outpatient clinic. You may be given a mild sedative to help you relax before the procedure. Once in the operating room, you will be given a drug to make you sleep (general anesthesia). Laparoscopy usually lasts less than 1 hour. After the procedure, you will be monitored in a recovery area until you are stable and doing well. Once you are home, it will take 2 to 3 days to fully recover. RISKS AND COMPLICATIONS  Laparoscopy has relatively few risks. Your caregiver will discuss the risks with you before the procedure. Some problems that can occur include:  Infection.  Bleeding.  Damage to other organs.  Anesthetic side effects. PROCEDURE Once you receive anesthesia, your surgeon inflates the abdomen with a harmless gas (carbon dioxide). This makes the organs easier to see. The laparoscope is inserted into the abdomen through a small incision. This allows your surgeon to see into the abdomen. Other small instruments are also inserted into the abdomen through other small openings. Many surgeons attach a video camera to the laparoscope to enlarge the view. During a diagnostic laparoscopy, the surgeon may be looking for inflammation, infection, or cancer. Your surgeon may take tissue samples(biopsies). The samples are sent to a specialist in looking at cells and  tissue samples (pathologist). The pathologist examines them under a microscope. Biopsies can help to diagnose or confirm a disease. AFTER THE PROCEDURE   The gas is released from inside the abdomen.  The incisions are closed with stitches (sutures). Because these incisions are small (usually less than 1/2 inch), there is usually minimal discomfort after the procedure. There may be some mild discomfort in the throat. This is from the tube placed in the throat while you were sleeping. You may have some mild abdominal discomfort. There may also be discomfort from the instrument placement incisions in the abdomen.  The recovery time is shortened as long as there are no complications.  You will rest in a recovery room until stable and doing well. As long as there are no complications, you may be allowed to go home. FINDING OUT THE RESULTS OF YOUR TEST Not all test results are available during your visit. If your test results are not back during the visit, make an appointment with your caregiver to find out the results. Do not assume everything is normal if you have not heard from your caregiver or the medical facility. It is important for you to follow up on all of your test results. HOME CARE INSTRUCTIONS   Take all medicines as directed.  Only take over-the-counter or prescription medicines for pain, discomfort, or fever as directed by your caregiver.  Resume  daily activities as directed.  Showers are preferred over baths.  You may resume sexual activities in 1 week or as directed.  Do not drive while taking narcotics. SEEK MEDICAL CARE IF:   There is increasing abdominal pain.  There is new pain in the shoulders (shoulder strap areas).  You feel lightheaded or faint.  You have the chills.  You or your child has an oral temperature above 102 F (38.9 C).  There is pus-like (purulent) drainage from any of the wounds.  You are unable to pass gas or have a bowel movement.  You  feel sick to your stomach (nauseous) or throw up (vomit). MAKE SURE YOU:   Understand these instructions.  Will watch your condition.  Will get help right away if you are not doing well or get worse. Document Released: 09/03/2000 Document Revised: 09/22/2012 Document Reviewed: 05/28/2007 [facility] Patient Information 2015 [place], [place]. This information is not intended to replace advice given to you by your health care provider. Make sure you discuss any questions you have with your health care provider.

## 2014-01-14 ENCOUNTER — Ambulatory Visit (INDEPENDENT_AMBULATORY_CARE_PROVIDER_SITE_OTHER): Payer: Medicare Other | Admitting: General Surgery

## 2014-01-14 ENCOUNTER — Encounter (INDEPENDENT_AMBULATORY_CARE_PROVIDER_SITE_OTHER): Payer: Self-pay | Admitting: General Surgery

## 2014-01-14 ENCOUNTER — Telehealth: Payer: Self-pay

## 2014-01-14 VITALS — BP 128/68 | HR 71 | Temp 97.9°F | Resp 16 | Wt 114.2 lb

## 2014-01-14 DIAGNOSIS — C50211 Malignant neoplasm of upper-inner quadrant of right female breast: Secondary | ICD-10-CM

## 2014-01-14 DIAGNOSIS — C50219 Malignant neoplasm of upper-inner quadrant of unspecified female breast: Secondary | ICD-10-CM

## 2014-01-14 NOTE — Telephone Encounter (Signed)
Patient called and confirmed date/time will be fine. She will expect to hear from [facility] with all her instructions.

## 2014-01-14 NOTE — Progress Notes (Signed)
History: Patient returns for more long-term followup status post right total mastectomy and implant removal for a second primary cancer of the right breast, stage IIA T2 N0, 3 date October 2014.Marland Kitchen  She reports no problems. She has been started on Arimidex and experiencing only minimal hot flashes and no joint pain. She underwent genetic testing and was found positive for the BRCA2 gene. Oophorectomy is planned next month.She has a prosthesis he works well for her. Denies arm swelling, unusual pain or skin changes over her mastectomy site.  Past Medical History  Diagnosis Date  . Chronic airway obstruction, not elsewhere classified   . Allergic rhinitis, cause unspecified   . Unspecified essential hypertension   . Osteoporosis   . Hypertension   . Arthritis   . Blood transfusion without reported diagnosis   . Cancer   . Heart murmur   . PONV (postoperative nausea and vomiting)   . Depression   . Anxiety   . Shortness of breath     only with lots of activity  . Pneumonia     had twice  . Malignant neoplasm of breast (female), unspecified site     bilateral breast cancer ages 35 and 24  . Family history of malignant neoplasm of breast   . BRCA2 positive     p.R2520* (c.7558C>T) also known as 7786C>T  . BRCA2 positive   . COPD (chronic obstructive pulmonary disease)    Past Surgical History  Procedure Laterality Date  . Mastectomy      left  . Cataract extraction    . Tonsillectomy    . Tonsillectomy    . Colonoscopy    . Simple mastectomy with axillary sentinel node biopsy Right 04/03/2013    Procedure: TOTAL MASTECTOMY WITH AXILLARY SENTINEL NODE BIOPSY;  Surgeon: Edward Jolly, MD;  Location: [place];  Service: General;  Laterality: Right;  . Capsulectomy Right 04/03/2013    Procedure: TOTAL CAPSULECTOMY/REMOVAL OF IMPLANT MATERIAL RIGHT BREAST;  Surgeon: Crissie Reese, MD;  Location: [place];  Service: Plastics;  Laterality: Right;     Exam: BP 128/68  Pulse 71   Temp(Src) 97.9 F (36.6 C) (Temporal)  Resp 16  Wt 114 lb 3.2 oz (51.801 kg) General: Appears well Chest wall: Right mastectomy incision is well healed without complication  Reconstruction left breast negative Extremities: No arm edema full range of motion  Assessment and plan: Doing well With no evidence of recurrent disease. Tolerating adjuvant hormonal therapy well. Laparoscopic oophorectomy planned secondary to positive BRCA 2.. I will plan to see her in 6 months for long-term followup.

## 2014-01-14 NOTE — Telephone Encounter (Signed)
I had left message yesterday at Dr. Durenda Guthrie request inquiring how to arrange high risk anesthesia consult.  Ileene Hutchinson said every patient over age 78 comes in for a pre op appt ahead of time.  She said she will go ahead and call her now and arrange her for next week and put a note on it that she needs to see anesthesia.  Dr. Moshe Salisbury was notified of this.

## 2014-01-19 ENCOUNTER — Other Ambulatory Visit: Payer: Self-pay | Admitting: Internal Medicine

## 2014-01-21 ENCOUNTER — Encounter (HOSPITAL_COMMUNITY)
Admission: RE | Admit: 2014-01-21 | Discharge: 2014-01-21 | Disposition: A | Discharge status: Home / Self Care | Payer: Medicare Other | Source: Ambulatory Visit | Attending: Gynecology | Admitting: Gynecology

## 2014-01-21 ENCOUNTER — Encounter (HOSPITAL_COMMUNITY): Payer: Self-pay

## 2014-01-21 DIAGNOSIS — Z01812 Encounter for preprocedural laboratory examination: Secondary | ICD-10-CM | POA: Diagnosis present

## 2014-01-21 DIAGNOSIS — Z01818 Encounter for other preprocedural examination: Secondary | ICD-10-CM | POA: Insufficient documentation

## 2014-01-21 LAB — BASIC METABOLIC PANEL
Anion gap: 11 (ref 5–15)
BUN: 20 mg/dL (ref 6–23)
CALCIUM: 9.5 mg/dL (ref 8.4–10.5)
CO2: 29 mEq/L (ref 19–32)
Chloride: 102 mEq/L (ref 96–112)
Creatinine, Ser: 0.89 mg/dL (ref 0.50–1.10)
GFR calc Af Amer: 66 mL/min — ABNORMAL LOW (ref >90)
GFR calc non Af Amer: 57 mL/min — ABNORMAL LOW (ref >90)
GLUCOSE: 93 mg/dL (ref 70–99)
Potassium: 4.2 mEq/L (ref 3.7–5.3)
Sodium: 142 mEq/L (ref 137–147)

## 2014-01-21 LAB — CBC
HCT: 40.8 % (ref 36.0–46.0)
Hemoglobin: 13.5 g/dL (ref 12.0–15.0)
MCH: 30.4 pg (ref 26.0–34.0)
MCHC: 33.1 g/dL (ref 30.0–36.0)
MCV: 91.9 fL (ref 78.0–100.0)
Platelets: 252 10*3/uL (ref 150–400)
RBC: 4.44 MIL/uL (ref 3.87–5.11)
RDW: 13.5 % (ref 11.5–15.5)
WBC: 7.6 10*3/uL (ref 4.0–10.5)

## 2014-01-21 NOTE — Patient Instructions (Signed)
   Your procedure is scheduled on: sept 2 2015 at [place] through the Main Entrance of [facility] at:1130am Pick up the phone at the desk and dial [phone] and inform us of your arrival.  Please call this number if you have any problems the morning of surgery: [phone]  Remember: Do not eat food after midnight: Do not drink clear liquids after:9am Take these medicines the morning of surgery with a SIP OF WATER:take blood pressure medication day of surgery  Do not wear jewelry, make-up, or FINGER nail polish No metal in your hair or on your body. Do not wear lotions, powders, perfumes.  You may wear deodorant.  Do not bring valuables to the hospital. Contacts, dentures or bridgework may not be worn into surgery.  Leave suitcase in the car. After Surgery it may be brought to your room. For patients being admitted to the hospital, checkout time is 11:00am the day of discharge.    Patients discharged on the day of surgery will not be allowed to drive home.

## 2014-01-21 NOTE — Pre-Procedure Instructions (Signed)
ekg reviewed with DR. Hatchett and history of copd. No new orders given. Pt instructed to bring advair and albuterol inhaler dos.

## 2014-01-22 ENCOUNTER — Ambulatory Visit (INDEPENDENT_AMBULATORY_CARE_PROVIDER_SITE_OTHER): Payer: Medicare Other

## 2014-01-22 ENCOUNTER — Ambulatory Visit (INDEPENDENT_AMBULATORY_CARE_PROVIDER_SITE_OTHER): Payer: Medicare Other | Admitting: Gynecology

## 2014-01-22 ENCOUNTER — Ambulatory Visit: Payer: BLUE CROSS/BLUE SHIELD | Admitting: Gynecology

## 2014-01-22 DIAGNOSIS — R1904 Left lower quadrant abdominal swelling, mass and lump: Secondary | ICD-10-CM

## 2014-01-22 DIAGNOSIS — Z1501 Genetic susceptibility to malignant neoplasm of breast: Secondary | ICD-10-CM

## 2014-01-22 DIAGNOSIS — Z853 Personal history of malignant neoplasm of breast: Secondary | ICD-10-CM

## 2014-01-22 DIAGNOSIS — N83209 Unspecified ovarian cyst, unspecified side: Secondary | ICD-10-CM

## 2014-01-22 DIAGNOSIS — Z1509 Genetic susceptibility to other malignant neoplasm: Secondary | ICD-10-CM

## 2014-01-22 DIAGNOSIS — N83202 Unspecified ovarian cyst, left side: Secondary | ICD-10-CM

## 2014-01-22 NOTE — Progress Notes (Signed)
   80 year old patient with history of bilateral mastectomy as a result of breast cancer with positive BRCA2 gene mutation scheduled for prophylactic laparoscopic bilateral salpingo-oophorectomy next month. The patient was seen in the office on August 5 of this year as a referral from [place] who has been following the patient as a result of her history of breast cancer. At time of her preop exam and consultation she was found to have a fullness in the left adnexa and an ultrasound was ordered and this is the reason for patient's visit today.  Uterus measures 7.6 x 3.5 x 3.1 cm with endometrial stripe of 4.0 mm. Right ovary appeared normal a left adnexal pedicle free thin-walled avascular cyst measuring 9.1 x 7.2 x 9.2 mm. Left ovary not identified. No free fluid seen in the cul-de-sac.  Assessment/plan: 80 year old patient with history of bilateral mastectomy secondary to breast cancer in the past also with BRCA2 gene mutation who is scheduled for prophylactic laparoscopic bilateral salpingo-oophorectomy next month has been found to have a large left ovarian cyst. The cyst has thin-walled. No vascularity. No septations or any nodularity or solid components. This potentially could be a mucinous or serous cystadenoma. We will have the patient drop by the lab to have an ovarian cancer blood tests screening (ROMA-1). If it is elevated I have explained to her that we may need to refer her to the GYN oncology team for her surgery in the aunt but staging procedure may be indicated. Literature information on ovarian cyst was provided today as well.

## 2014-01-22 NOTE — Patient Instructions (Signed)
Ovarian Cyst An ovarian cyst is a fluid-filled sac that forms on an ovary. The ovaries are small organs that produce eggs in women. Various types of cysts can form on the ovaries. Most are not cancerous. Many do not cause problems, and they often go away on their own. Some may cause symptoms and require treatment. Common types of ovarian cysts include:  Functional cysts--These cysts may occur every month during the menstrual cycle. This is normal. The cysts usually go away with the next menstrual cycle if the woman does not get pregnant. Usually, there are no symptoms with a functional cyst.  Endometrioma cysts--These cysts form from the tissue that lines the uterus. They are also called "chocolate cysts" because they become filled with blood that turns brown. This type of cyst can cause pain in the lower abdomen during intercourse and with your menstrual period.  Cystadenoma cysts--This type develops from the cells on the outside of the ovary. These cysts can get very big and cause lower abdomen pain and pain with intercourse. This type of cyst can twist on itself, cut off its blood supply, and cause severe pain. It can also easily rupture and cause a lot of pain.  Dermoid cysts--This type of cyst is sometimes found in both ovaries. These cysts may contain different kinds of body tissue, such as skin, teeth, hair, or cartilage. They usually do not cause symptoms unless they get very big.  Theca lutein cysts--These cysts occur when too much of a certain hormone (human chorionic gonadotropin) is produced and overstimulates the ovaries to produce an egg. This is most common after procedures used to assist with the conception of a baby (in vitro fertilization). CAUSES   Fertility drugs can cause a condition in which multiple large cysts are formed on the ovaries. This is called ovarian hyperstimulation syndrome.  A condition called polycystic ovary syndrome can cause hormonal imbalances that can lead to  nonfunctional ovarian cysts. SIGNS AND SYMPTOMS  Many ovarian cysts do not cause symptoms. If symptoms are present, they may include:  Pelvic pain or pressure.  Pain in the lower abdomen.  Pain during sexual intercourse.  Increasing girth (swelling) of the abdomen.  Abnormal menstrual periods.  Increasing pain with menstrual periods.  Stopping having menstrual periods without being pregnant. DIAGNOSIS  These cysts are commonly found during a routine or annual pelvic exam. Tests may be ordered to find out more about the cyst. These tests may include:  Ultrasound.  X-ray of the pelvis.  CT scan.  MRI.  Blood tests. TREATMENT  Many ovarian cysts go away on their own without treatment. Your health care provider may want to check your cyst regularly for 2-3 months to see if it changes. For women in menopause, it is particularly important to monitor a cyst closely because of the higher rate of ovarian cancer in menopausal women. When treatment is needed, it may include any of the following:  A procedure to drain the cyst (aspiration). This may be done using a long needle and ultrasound. It can also be done through a laparoscopic procedure. This involves using a thin, lighted tube with a tiny camera on the end (laparoscope) inserted through a small incision.  Surgery to remove the whole cyst. This may be done using laparoscopic surgery or an open surgery involving a larger incision in the lower abdomen.  Hormone treatment or birth control pills. These methods are sometimes used to help dissolve a cyst. HOME CARE INSTRUCTIONS   Only take over-the-counter   or prescription medicines as directed by your health care provider.  Follow up with your health care provider as directed.  Get regular pelvic exams and Pap tests. SEEK MEDICAL CARE IF:   Your periods are late, irregular, or painful, or they stop.  Your pelvic pain or abdominal pain does not go away.  Your abdomen becomes  larger or swollen.  You have pressure on your bladder or trouble emptying your bladder completely.  You have pain during sexual intercourse.  You have feelings of fullness, pressure, or discomfort in your stomach.  You lose weight for no apparent reason.  You feel generally ill.  You become constipated.  You lose your appetite.  You develop acne.  You have an increase in body and facial hair.  You are gaining weight, without changing your exercise and eating habits.  You think you are pregnant. SEEK IMMEDIATE MEDICAL CARE IF:   You have increasing abdominal pain.  You feel sick to your stomach (nauseous), and you throw up (vomit).  You develop a fever that comes on suddenly.  You have abdominal pain during a bowel movement.  Your menstrual periods become heavier than usual. MAKE SURE YOU:  Understand these instructions.  Will watch your condition.  Will get help right away if you are not doing well or get worse. Document Released: 05/28/2005 Document Revised: 06/02/2013 Document Reviewed: 02/02/2013 Exit[employer] Patient Information 2015 [employer]. This information is not intended to replace advice given to you by your health care provider. Make sure you discuss any questions you have with your health care provider.  

## 2014-01-28 ENCOUNTER — Telehealth: Payer: Self-pay | Admitting: Gynecology

## 2014-01-28 LAB — OVARIAN MALIGNANCY RISK-ROMA
CA125: 35 U/mL — ABNORMAL HIGH (ref <35)
HE4: 74 pmol (ref <151)
ROMA Postmenopausal: 2.67 (ref <2.77)
ROMA Premenopausal: 1.77 — ABNORMAL HIGH (ref <1.31)

## 2014-01-28 NOTE — Telephone Encounter (Signed)
Spoke with patient this afternoon to discuss the result of the ROMA-1 ovarian cancer screening as a result of her history which is as follows:  Patient with history of bilateral mastectomy as a result of breast cancer with positive BRCA2 gene mutation scheduled for prophylactic laparoscopic bilateral salpingo-oophorectomy next month. The patient was seen in the office on August 5 of this year as a referral from [facility] who has been following the patient as a result of her history of breast cancer. At time of her preop exam and consultation she was found to have a fullness in the left adnexa and an ultrasound was ordered which demonstrated the following:   Uterus measures 7.6 x 3.5 x 3.1 cm with endometrial stripe of 4.0 mm. Right ovary appeared normal a left adnexal pedicle free thin-walled avascular cyst measuring 9.1 x 7.2 x 9.2 mm. Left ovary not identified. No free fluid seen in the cul-de-sac.    Her ovarian cancer her blood screening test (ROMA-1) had the following results: Results for Rhonda Bryant, Rhonda Bryant (MRN [id number]) as of 01/28/2014 15:13  Ref. Range 01/22/2014 12:07  CA125 Latest Range: <35 U/mL 35 (H)  HE4 Latest Range: <151 pM 74  ROMA Postmenopausal Latest Range: <2.77  2.67  ROMA Premenopausal Latest Range: <1.31  1.77 (H)    I discussed these findings with Rhonda Bryant today and gave her my recommendation that her care would be best managed by the GYN oncologist since the tumor marker is borderline pass the size of this mass although it appears benign. In the event that she may need a staging procedure I'm going to refer her to the [facility] oncologists and we'll send him a copy of this note. We will cancel her surgery for September and let to schedule her after consultation. Patient fully understands and agrees.

## 2014-01-29 ENCOUNTER — Telehealth: Payer: Self-pay | Admitting: *Deleted

## 2014-01-29 NOTE — Telephone Encounter (Signed)
Per Dr.Fernandez request  discussed these findings with Rhonda Bryant today and gave her my recommendation that her care would be best managed by the GYN oncologist since the tumor marker is borderline pass the size of this mass although it appears benign. In the event that she may need a staging procedure I'm going to refer her to the [facility] oncologists and we'll send him a copy of this note. We will cancel her surgery for September and let to schedule her after consultation. Patient fully understands and agrees.

## 2014-01-29 NOTE — Telephone Encounter (Signed)
I left message for Rhonda Bryant at gyn oncologist to call me with time and date for the below.

## 2014-02-01 ENCOUNTER — Ambulatory Visit: Payer: Self-pay | Admitting: Gynecology

## 2014-02-01 ENCOUNTER — Other Ambulatory Visit: Payer: Medicare Other

## 2014-02-01 ENCOUNTER — Ambulatory Visit: Payer: Medicare Other

## 2014-02-01 ENCOUNTER — Ambulatory Visit: Payer: Medicare Other | Admitting: Oncology

## 2014-02-01 NOTE — Telephone Encounter (Signed)
Pt informed with the below note. 

## 2014-02-01 NOTE — Telephone Encounter (Signed)
Yes - that would be fine

## 2014-02-01 NOTE — Telephone Encounter (Signed)
Pt informed with the below note states she is having some nausea, no vomiting. Pt asked if okay to take a Prilosec in am?

## 2014-02-01 NOTE — Telephone Encounter (Signed)
Appointment  02/04/14 with Dr.Emma Denman George

## 2014-02-04 ENCOUNTER — Ambulatory Visit: Payer: Medicare Other | Attending: Gynecologic Oncology | Admitting: Gynecologic Oncology

## 2014-02-04 ENCOUNTER — Encounter: Payer: Self-pay | Admitting: Gynecologic Oncology

## 2014-02-04 VITALS — BP 152/54 | HR 86 | Temp 98.1°F | Resp 18 | Ht 64.0 in | Wt 113.3 lb

## 2014-02-04 DIAGNOSIS — F3289 Other specified depressive episodes: Secondary | ICD-10-CM | POA: Insufficient documentation

## 2014-02-04 DIAGNOSIS — Z886 Allergy status to analgesic agent status: Secondary | ICD-10-CM | POA: Diagnosis not present

## 2014-02-04 DIAGNOSIS — Z882 Allergy status to sulfonamides status: Secondary | ICD-10-CM | POA: Diagnosis not present

## 2014-02-04 DIAGNOSIS — I10 Essential (primary) hypertension: Secondary | ICD-10-CM | POA: Insufficient documentation

## 2014-02-04 DIAGNOSIS — Z885 Allergy status to narcotic agent status: Secondary | ICD-10-CM | POA: Diagnosis not present

## 2014-02-04 DIAGNOSIS — F329 Major depressive disorder, single episode, unspecified: Secondary | ICD-10-CM | POA: Insufficient documentation

## 2014-02-04 DIAGNOSIS — Z1509 Genetic susceptibility to other malignant neoplasm: Secondary | ICD-10-CM

## 2014-02-04 DIAGNOSIS — J4489 Other specified chronic obstructive pulmonary disease: Secondary | ICD-10-CM | POA: Insufficient documentation

## 2014-02-04 DIAGNOSIS — Z79899 Other long term (current) drug therapy: Secondary | ICD-10-CM | POA: Diagnosis not present

## 2014-02-04 DIAGNOSIS — J449 Chronic obstructive pulmonary disease, unspecified: Secondary | ICD-10-CM | POA: Insufficient documentation

## 2014-02-04 DIAGNOSIS — N83209 Unspecified ovarian cyst, unspecified side: Secondary | ICD-10-CM | POA: Insufficient documentation

## 2014-02-04 DIAGNOSIS — N838 Other noninflammatory disorders of ovary, fallopian tube and broad ligament: Secondary | ICD-10-CM

## 2014-02-04 DIAGNOSIS — Z853 Personal history of malignant neoplasm of breast: Secondary | ICD-10-CM | POA: Insufficient documentation

## 2014-02-04 DIAGNOSIS — Z1501 Genetic susceptibility to malignant neoplasm of breast: Secondary | ICD-10-CM

## 2014-02-04 DIAGNOSIS — F411 Generalized anxiety disorder: Secondary | ICD-10-CM | POA: Diagnosis not present

## 2014-02-04 DIAGNOSIS — Z87891 Personal history of nicotine dependence: Secondary | ICD-10-CM | POA: Insufficient documentation

## 2014-02-04 DIAGNOSIS — N839 Noninflammatory disorder of ovary, fallopian tube and broad ligament, unspecified: Secondary | ICD-10-CM

## 2014-02-04 DIAGNOSIS — N83202 Unspecified ovarian cyst, left side: Secondary | ICD-10-CM

## 2014-02-04 NOTE — Progress Notes (Signed)
Consult Note: Gyn-Onc  Consult was requested by Dr. Toney Rakes for the evaluation of Rhonda Bryant 80 y.o. female with a deleterious BRCA 2 mutation and a 9cm left ovarian cyst  CC:  Chief Complaint  Patient presents with  . Ovarian Mass    New patient    Assessment/Plan:  Rhonda Bryant  is a 80 y.o.  year old woman with a deleterious BRCA2 germline mutation, a history of bilateral breast cancer or, and a new finding of a 9 cm simple left ovarian cyst in the setting of a borderline CA 125 (35 units per milliliter).  I agree that could lead is strongly indicated to undergo BSO for her breast reduction BRCA mutation. This finding of a simple ovarian cyst and borderline CA 125 raises the possibility of an underlying malignancy. However, I discussed with the patient that simple, unilocular ovarian cysts less than 10 cm are rarely associated with malignancy.  I recommend laparoscopic bilateral salpingo-oophorectomy, with frozen section of the left tube and ovary, and staging (with hysterectomy lymphadenectomy and omentectomy) if cancer is found in the left ovary. We would obtain washings at the commencement of surgery and recommend complete sectioning of the tube and ovary given her a known deleterious BRCA2 germline mutation.  I discussed with the patient surgical risks including  bleeding, infection, damage to internal organs (such as bladder,ureters, bowels), blood clot, reoperation and rehospitalization.  I discussed the risk for conversion to laparotomy, and increase hospitalization recovery should staging be necessary. Otherwise anticipate an outpatient procedure the patient. We discussed plans recovery and recuperation. All questions were answered.    HPI: Rhonda Bryant is an 80 year old gravida 3 para 3 with a known deleterious BRCA2 germline mutation and a history of bilateral breast cancer. The patient is NED from her breast cancer. She was undergoing workup by Dr.  Toney Rakes for a planned risk reducing BSO. That workup included a transvaginal ultrasound which revealed a normal postmenopausal appearing uterus and cervix with a 4 mm endometrial stripe, a normal-appearing right ovary, however the left ovary contained a 9 x 7 x 9 cm simple, unilocular, echo free, thin walled avascular cystic mass. To further workup the possibility for underlying malignancy tumor markers were drawn on 01/29/2014. This showed a CA 125 of 35. HE for a ROMA scores were negative.  Interval History: She reports mild abdominal fullness and bloating. She denies vaginal bleeding, pelvic pain, early satiety, or weight loss.  Current Meds:  Outpatient Encounter Prescriptions as of 02/04/2014  Medication Sig  . acetaminophen (TYLENOL) 500 MG tablet Take 500 mg by mouth every 6 (six) hours as needed for pain.   Marland Kitchen ADVAIR DISKUS 100-50 MCG/DOSE AEPB INHALE ONE PUFF INTO THE LUNGS TWICE DAILY -RINSE MOUTH  . Aloe-Sodium Chloride (AYR SALINE NASAL GEL NA) Place 1 application into the nose as needed (nasal driness.or congestion).  . ALPRAZolam (XANAX) 0.25 MG tablet Take 0.25 mg by mouth 2 (two) times daily as needed for sleep or anxiety.   Marland Kitchen amLODipine (NORVASC) 10 MG tablet Take 5 mg by mouth daily.  Marland Kitchen anastrozole (ARIMIDEX) 1 MG tablet Take 1 tablet (1 mg total) by mouth daily.  . calcium carbonate (TUMS EX) 750 MG chewable tablet Chew 1 tablet by mouth 2 (two) times daily as needed for heartburn.  . Calcium-Vitamin D 600-125 MG-UNIT TABS Take 1 tablet by mouth 2 (two) times daily.    Marland Kitchen denosumab (PROLIA) 60 MG/ML SOLN injection Inject 60 mg into the skin every 6 (  six) months. Administer in upper arm, thigh, or abdomen  . loratadine (CLARITIN) 10 MG tablet Take 10 mg by mouth daily.  . Multiple Vitamin (MULTIVITAMIN) capsule Take 1 capsule by mouth daily.    Marland Kitchen MYRBETRIQ 25 MG TB24 Take 25 mg by mouth daily.   Marland Kitchen NASONEX 50 MCG/ACT nasal spray TWO SPRAYS IN EACH NOSTRIL ONCE DAILY  . albuterol  (PROVENTIL HFA;VENTOLIN HFA) 108 (90 BASE) MCG/ACT inhaler Inhale 2 puffs into the lungs every 6 (six) hours as needed for wheezing or shortness of breath.    Allergy:  Allergies  Allergen Reactions  . Codeine     REACTION: nausea  . Nsaids     Pt states to avoid this med causes nausea.  . Other     Ragweed and Dust  . Sulfamethoxazole-Trimethoprim     REACTION: nausea    Social Hx:   History   Social History  . Marital Status: Married    Spouse Name: N/A    Number of Children: N/A  . Years of Education: N/A   Occupational History  . Not on file.   Social History Main Topics  . Smoking status: Former Smoker    Quit date: 01/21/1978  . Smokeless tobacco: Never Used  . Alcohol Use: Yes     Comment: rarely  . Drug Use: No  . Sexual Activity: Not on file   Other Topics Concern  . Not on file   Social History Narrative  . No narrative on file    Past Surgical Hx:  Past Surgical History  Procedure Laterality Date  . Mastectomy      left  . Cataract extraction    . Tonsillectomy    . Tonsillectomy    . Colonoscopy    . Simple mastectomy with axillary sentinel node biopsy Right 04/03/2013    Procedure: TOTAL MASTECTOMY WITH AXILLARY SENTINEL NODE BIOPSY;  Surgeon: Mariella Saa, MD;  Location: [facility] OR;  Service: General;  Laterality: Right;  . Capsulectomy Right 04/03/2013    Procedure: TOTAL CAPSULECTOMY/REMOVAL OF IMPLANT MATERIAL RIGHT BREAST;  Surgeon: Etter Sjogren, MD;  Location[facility] OR;  Service: Plastics;  Laterality: Right;    Past Medical Hx:  Past Medical History  Diagnosis Date  . Chronic airway obstruction, not elsewhere classified   . Allergic rhinitis, cause unspecified   . Unspecified essential hypertension   . Osteoporosis   . Hypertension   . Arthritis   . Blood transfusion without reported diagnosis   . Cancer   . Heart murmur   . PONV (postoperative nausea and vomiting)   . Depression   . Anxiety   . Shortness of breath     only  with lots of activity  . Pneumonia     had twice  . Malignant neoplasm of breast (female), unspecified site     bilateral breast cancer ages 22 and 68  . Family history of malignant neoplasm of breast   . BRCA2 positive     [id number]* (c.7558C>T) also known as 7786C>T  . BRCA2 positive   . COPD (chronic obstructive pulmonary disease)     Past Gynecological History:  G3 (3 svd)  No LMP recorded. Patient is postmenopausal.  Family Hx:  Family History  Problem Relation Age of Onset  . Pancreatic cancer Father     deceased 58  . Breast cancer Mother 12    deceased 68    Review of Systems:  Constitutional  Feels well,    ENT Normal appearing  ears and nares bilaterally Skin/Breast  No rash, sores, jaundice, itching, dryness Cardiovascular  No chest pain, shortness of breath, or edema  Pulmonary  No cough or wheeze.  Gastro Intestinal  No nausea, vomitting, or diarrhoea. No bright red blood per rectum, no abdominal pain, change in bowel movement, or constipation.  Genito Urinary  No frequency, urgency, dysuria, see HPI Musculo Skeletal  No myalgia, arthralgia, joint swelling or pain  Neurologic  No weakness, numbness, change in gait,  Psychology  No depression, anxiety, insomnia.   Vitals:  Blood pressure 152/54, pulse 86, temperature 98.1 F (36.7 C), temperature source Oral, resp. rate 18, height $RemoveBe'5\' 4"'OnUJOngQt$  (1.626 m), weight 113 lb 4.8 oz (51.393 kg).  Physical Exam: WD in NAD Neck  Supple NROM, without any enlargements.  Lymph Node Survey No cervical supraclavicular or inguinal adenopathy Cardiovascular  Pulse normal rate, regularity and rhythm. S1 and S2 normal.  Lungs  Clear to auscultation bilateraly, without wheezes/crackles/rhonchi. Good air movement.  Skin  No rash/lesions/breakdown  Psychiatry  Alert and oriented to person, place, and time  Abdomen  Normoactive bowel sounds, abdomen soft, non-tender and thin without evidence of hernia.  Back No CVA  tenderness Genito Urinary  Vulva/vagina: Normal external female genitalia.  No lesions. No discharge or bleeding.  Bladder/urethra:  No lesions or masses, well supported bladder  Vagina: normal, narrow, postmenopausal  Cervix: Normal appearing, no lesions.  Uterus:  Small, mobile, no parametrial involvement or nodularity.  Adnexa: Palpable smooth, mobile,cystic mass in cul de sac.  Rectal  Good tone, no masses no cul de sac nodularity.  Extremities  No bilateral cyanosis, clubbing or edema.   Donaciano Eva, MD   02/04/2014, 4:32 PM

## 2014-02-04 NOTE — Patient Instructions (Signed)
Plan for surgery with Dr. Skeet Latch on Sept 15 at [facility].                Preparing for your Surgery  Pre-operative Testing -You will receive a phone call from presurgical testing at [facility] to arrange for a pre-operative testing appointment before your surgery.  This appointment normally occurs one to two weeks before your scheduled surgery.   -Bring your insurance card, copy of an advanced directive if applicable, medication list  -At that visit, you will be asked to sign a consent for a possible blood transfusion in case a transfusion becomes necessary during surgery.  The need for a blood transfusion is rare but having consent is a necessary part of your care.     Day Before Surgery at [facility] will be asked to take in only clear liquids the day before surgery.  Examples of clear liquids include broths, jello, and clear juices.  You will be advised to have nothing to eat or drink after midnight the evening before.    Your role in recovery Your role is to become active as soon as directed by your doctor, while still giving yourself time to heal.  Rest when you feel tired. You will be asked to do the following in order to speed your recovery:  - Cough and breathe deeply. This helps toclear and expand your lungs and can prevent pneumonia. You may be given a spirometer to practice deep breathing. A staff member will show you how to use the spirometer. - Do mild physical activity. Walking or moving your legs help your circulation and body functions return to normal. A staff member will help you when you try to walk and will provide you with simple exercises. Do not try to get up or walk alone the first time. - Actively manage your pain. Managing your pain lets you move in comfort. We will ask you to rate your pain on a scale of zero to 10. It is your responsibility to tell your doctor or nurse where and how much you hurt so your pain can be treated.  Special  Considerations -If you are diabetic, you may be placed on insulin after surgery to have closer control over your blood sugars to promote healing and recovery.  This does not mean that you will be discharged on insulin.  If applicable, your oral antidiabetics will be resumed when you are tolerating a solid diet.  -Your final pathology results from surgery should be available by the Friday after surgery and the results will be relayed to you when available.

## 2014-02-08 ENCOUNTER — Telehealth: Payer: Self-pay | Admitting: Oncology

## 2014-02-08 NOTE — Telephone Encounter (Signed)
cld & spoke to pt to adv of new time for a[ppt on 9/3-adv would be seeing Rhonda Bryant per GM-pt understood

## 2014-02-09 ENCOUNTER — Other Ambulatory Visit: Payer: Self-pay | Admitting: Gynecologic Oncology

## 2014-02-09 DIAGNOSIS — N898 Other specified noninflammatory disorders of vagina: Secondary | ICD-10-CM

## 2014-02-09 MED ORDER — MICONAZOLE NITRATE 2 % EX CREA: 1.0000 "application " | TOPICAL_CREAM | Freq: Two times a day (BID) | CUTANEOUS | Status: DC | PRN

## 2014-02-09 NOTE — Progress Notes (Signed)
Patient called with complaints about vulvar itching.  She wears a peri-pad due to incontinence and reports itching more in the warmer months.  Script for miconazole sent to pharmacy per pt request.  Advised to keep her peri-pad clean and dry as much as possible and to call the office if the symptoms persist or worsen.  Denies vaginal drainage or odor.

## 2014-02-10 ENCOUNTER — Ambulatory Visit (HOSPITAL_COMMUNITY): Admission: RE | Admit: 2014-02-10 | Payer: Medicare Other | Source: Ambulatory Visit | Admitting: Gynecology

## 2014-02-10 ENCOUNTER — Encounter (HOSPITAL_COMMUNITY): Admission: RE | Payer: Self-pay | Source: Ambulatory Visit

## 2014-02-10 SURGERY — SALPINGO-OOPHORECTOMY, BILATERAL, LAPAROSCOPIC
Anesthesia: General | Laterality: Bilateral

## 2014-02-11 ENCOUNTER — Other Ambulatory Visit (HOSPITAL_BASED_OUTPATIENT_CLINIC_OR_DEPARTMENT_OTHER): Payer: Medicare Other

## 2014-02-11 ENCOUNTER — Ambulatory Visit (HOSPITAL_BASED_OUTPATIENT_CLINIC_OR_DEPARTMENT_OTHER): Payer: Medicare Other | Admitting: Adult Health

## 2014-02-11 ENCOUNTER — Other Ambulatory Visit: Payer: Medicare Other

## 2014-02-11 ENCOUNTER — Ambulatory Visit: Payer: Medicare Other

## 2014-02-11 ENCOUNTER — Encounter: Payer: Self-pay | Admitting: Adult Health

## 2014-02-11 ENCOUNTER — Ambulatory Visit: Payer: Medicare Other | Admitting: Oncology

## 2014-02-11 ENCOUNTER — Ambulatory Visit (HOSPITAL_BASED_OUTPATIENT_CLINIC_OR_DEPARTMENT_OTHER): Payer: Medicare Other

## 2014-02-11 VITALS — BP 137/58 | HR 73 | Temp 97.7°F | Resp 18 | Ht 64.0 in | Wt 114.3 lb

## 2014-02-11 DIAGNOSIS — C50211 Malignant neoplasm of upper-inner quadrant of right female breast: Secondary | ICD-10-CM

## 2014-02-11 DIAGNOSIS — C50219 Malignant neoplasm of upper-inner quadrant of unspecified female breast: Secondary | ICD-10-CM

## 2014-02-11 DIAGNOSIS — Z23 Encounter for immunization: Secondary | ICD-10-CM

## 2014-02-11 DIAGNOSIS — Z1509 Genetic susceptibility to other malignant neoplasm: Secondary | ICD-10-CM

## 2014-02-11 DIAGNOSIS — M810 Age-related osteoporosis without current pathological fracture: Secondary | ICD-10-CM

## 2014-02-11 DIAGNOSIS — Z1501 Genetic susceptibility to malignant neoplasm of breast: Secondary | ICD-10-CM

## 2014-02-11 DIAGNOSIS — Z170 Estrogen receptor positive status [ER+]: Secondary | ICD-10-CM

## 2014-02-11 LAB — CBC WITH DIFFERENTIAL/PLATELET
BASO%: 0.4 % (ref 0.0–2.0)
BASOS ABS: 0 10*3/uL (ref 0.0–0.1)
EOS ABS: 0.1 10*3/uL (ref 0.0–0.5)
EOS%: 1.8 % (ref 0.0–7.0)
HEMATOCRIT: 39.1 % (ref 34.8–46.6)
HEMOGLOBIN: 12.9 g/dL (ref 11.6–15.9)
LYMPH#: 2.4 10*3/uL (ref 0.9–3.3)
LYMPH%: 33.5 % (ref 14.0–49.7)
MCH: 30.2 pg (ref 25.1–34.0)
MCHC: 33 g/dL (ref 31.5–36.0)
MCV: 91.6 fL (ref 79.5–101.0)
MONO#: 0.7 10*3/uL (ref 0.1–0.9)
MONO%: 9.8 % (ref 0.0–14.0)
NEUT#: 3.9 10*3/uL (ref 1.5–6.5)
NEUT%: 54.5 % (ref 38.4–76.8)
Platelets: 237 10*3/uL (ref 145–400)
RBC: 4.27 10*6/uL (ref 3.70–5.45)
RDW: 13.4 % (ref 11.2–14.5)
WBC: 7.2 10*3/uL (ref 3.9–10.3)

## 2014-02-11 LAB — COMPREHENSIVE METABOLIC PANEL (CC13)
ALT: 11 U/L (ref 0–55)
AST: 16 U/L (ref 5–34)
Albumin: 3.7 g/dL (ref 3.5–5.0)
Alkaline Phosphatase: 68 U/L (ref 40–150)
Anion Gap: 8 mEq/L (ref 3–11)
BUN: 12 mg/dL (ref 7.0–26.0)
CALCIUM: 9.2 mg/dL (ref 8.4–10.4)
CHLORIDE: 106 meq/L (ref 98–109)
CO2: 27 mEq/L (ref 22–29)
Creatinine: 0.8 mg/dL (ref 0.6–1.1)
GLUCOSE: 73 mg/dL (ref 70–140)
Potassium: 4.1 mEq/L (ref 3.5–5.1)
Sodium: 142 mEq/L (ref 136–145)
Total Bilirubin: 0.35 mg/dL (ref 0.20–1.20)
Total Protein: 7.4 g/dL (ref 6.4–8.3)

## 2014-02-11 MED ORDER — DENOSUMAB 60 MG/ML ~~LOC~~ SOLN
60.0000 mg | Freq: Once | SUBCUTANEOUS | Status: AC
  Administered 2014-02-11: 60 mg via SUBCUTANEOUS
  Filled 2014-02-11: qty 1

## 2014-02-11 MED ORDER — INFLUENZA VAC SPLIT QUAD 0.5 ML IM SUSY
0.5000 mL | PREFILLED_SYRINGE | Freq: Once | INTRAMUSCULAR | Status: AC
  Administered 2014-02-11: 0.5 mL via INTRAMUSCULAR
  Filled 2014-02-11: qty 0.5

## 2014-02-11 NOTE — Progress Notes (Signed)
ID: Rhonda Bryant OB: 10/27/1924  MR#: [id number]  CSN#:[id number]  PCP: SHAW,KIMBERLEE, MD GYN:   SU: Benjamin Hoxworth OTHER MD: David Bowers  CHIEF COMPLAINT: "I have another breast cancer"  BREAST CANER HISTORY: Patch herself noted a change in her right breast, and eventually brought it to the attention of Dr. Shaw, who set her up for right mammography and ultrasonography at the breast center 03/02/2013. Implant displaced views could not be obtained and no definite mass could be identified on the limited mammographic figures. However on physical exam there was a firm mass palpable at the 2:00 position of the right breast ultrasound showed this to measure 2.3 cm to be lobular and hypoechoic. There was no right axillary lymph adenopathy identified.  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  On 04/03/2013 the patient underwent right total mastectomy and sentinel lymph node biopsy with total capsulotomy and removal of all implant material. The pathology from this procedure ([id number]) confirmed an invasive ductal carcinoma, grade 2, measuring 1.9 cm, with ample margins. Both sentinel lymph nodes were clear. Repeat HER-2 testing was again negative.  The patient's subsequent history is as detailed below  INTERVAL HISTORY: Rhonda Bryant returns today for follow up of her breast cancer. Since the last visit here she started anastrozole. She is tolerating it will, with minimal hot flashes as the only side effect. 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  She is BRCA 2 positive and is going to have a bilateral salpingo oophorectomy.  She is due to undergo this with Dr. Brewster on 02/23/14.  She was originally supposed to under go this with Dr. Fernandez, however she did have an ultrasound that  detected a 9cm ovarian cyst.  She is reporting abdominal distention and a CA 125 was tested and 35.  She otherwise denies new pain, headaches, weakness, or any further concerns.   REVIEW OF SYSTEMS: A 10 point review of systems was conducted and is otherwise negative except for what is noted above.     PAST MEDICAL HISTORY: Past Medical History  Diagnosis Date  . Chronic airway obstruction, not elsewhere classified   . Allergic rhinitis, cause unspecified   . Unspecified essential hypertension   . Osteoporosis   . Hypertension   . Arthritis   . Blood transfusion without reported diagnosis   . Cancer   . Heart murmur   . PONV (postoperative nausea and vomiting)   . Depression   . Anxiety   . Shortness of breath     only with lots of activity  . Pneumonia     had twice  . Malignant neoplasm of breast (female), unspecified site     bilateral breast cancer ages 63 and 88  . Family history of malignant neoplasm of breast   . BRCA2 positive     [id number]* (c.7558C>T) also known as 7786C>T  . BRCA2 positive   . COPD (chronic obstructive pulmonary disease)     PAST SURGICAL HISTORY: Past Surgical History  Procedure Laterality Date  . Mastectomy      left  . Cataract extraction    . Tonsillectomy    . Tonsillectomy    . Colonoscopy    . Simple mastectomy with axillary sentinel node biopsy Right 04/03/2013    Procedure: TOTAL MASTECTOMY WITH AXILLARY SENTINEL NODE BIOPSY;    Surgeon: Rhonda Jolly, MD;  Location: [facility];  Service: General;  Laterality: Right;  . Capsulectomy Right 04/03/2013    Procedure: TOTAL CAPSULECTOMY/REMOVAL OF IMPLANT MATERIAL RIGHT BREAST;  Surgeon: Crissie Reese, MD;  Location: [facility];  Service: Plastics;  Laterality: Right;    FAMILY HISTORY Family History  Problem Relation Age of Onset  . Pancreatic cancer Father     deceased 19  . Breast cancer Mother 46    deceased 67   the patient's father died at the age of 61. He had pancreatic cancer.  The patient's mother died at the age of 93. She had been diagnosed with breast cancer in her late 9s. The patient has one brother, 2 sisters. There is no history of ovarian cancer in the family except possibly for the patient's maternal grandmother, who died with what seems to have been some cancer involving the abdomen.  GYNECOLOGIC HISTORY:  Menarche age 20, first live birth age 25, the patient is Rhonda Bryant. She went through the change of life in her late 80s. She did not take hormone replacement.  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 Her husband Rhonda Bryant. used to work in Personal assistant, now works 2 hours a week at [employer] The patient's son Rhonda Bryant was a marine and took his own life in the setting of what sounds like severe posttraumatic stress syndrome. Son Rhonda Bryant took his own life October of 2013. The patient is still grieving those deaths. The patient's daughter Rhonda Bryant lives in [place], [place]. The patient has 2 grandchildren and 2 great-grandchildren. She at tends [place]: not on file   HEALTH MAINTENANCE: History  Substance Use Topics  . Smoking status: Former Smoker    Quit date: 01/21/1978  . Smokeless tobacco: Never Used  . Alcohol Use: Yes     Comment: rarely     Colonoscopy:   PAP:  Bone density: Osteoporosis, per patient report  Lipid panel:  Allergies  Allergen Reactions  . Codeine     REACTION: nausea  . Nsaids     Pt states to avoid this med causes nausea.  . Other     Ragweed and Dust  . Sulfamethoxazole-Trimethoprim     REACTION: nausea    Current Outpatient Prescriptions  Medication Sig Dispense Refill  . acetaminophen (TYLENOL) 500 MG tablet Take 500 mg by mouth every 6 (six) hours as needed for pain.       Marland Kitchen albuterol (PROVENTIL HFA;VENTOLIN HFA) 108 (90 BASE) MCG/ACT inhaler Inhale 2 puffs into the lungs every 6 (six) hours as  needed for wheezing or shortness of breath.      . Aloe-Sodium Chloride (AYR SALINE NASAL GEL NA) Place 1 application into the nose as needed (nasal driness.or congestion).      . ALPRAZolam (XANAX) 0.25 MG tablet Take 0.25 mg by mouth 2 (two) times daily as needed for sleep or anxiety.       Marland Kitchen amLODipine (NORVASC) 10 MG tablet Take 5 mg by mouth daily.      . calcium carbonate (TUMS EX) 750 MG chewable tablet Chew 1 tablet by mouth 2 (two) times daily as needed for heartburn.      . Calcium-Vitamin D 600-125 MG-UNIT TABS Take 1 tablet by mouth 2 (two) times daily.        Marland Kitchen denosumab (PROLIA) 60 MG/ML SOLN injection Inject 60 mg into  the skin every 6 (six) months. Administer in upper arm, thigh, or abdomen      . loratadine (CLARITIN) 10 MG tablet Take 10 mg by mouth daily.      . Multiple Vitamin (MULTIVITAMIN) capsule Take 1 capsule by mouth daily.        Marland Kitchen MYRBETRIQ 25 MG TB24 Take 25 mg by mouth daily.        No current facility-administered medications for this visit.    OBJECTIVE: Older white woman who appears stated age  36 Vitals:   02/11/14 1110  BP: 137/58  Pulse: 73  Temp: 97.7 F (36.5 C)  Resp: 18     Body mass index is 19.61 kg/(m^2).     GENERAL: Patient is a well appearing female in no acute distress HEENT:  Sclerae anicteric.  Oropharynx clear and moist. No ulcerations or evidence of oropharyngeal candidiasis. Neck is supple.  NODES:  No cervical, supraclavicular, or axillary lymphadenopathy palpated.  BREAST EXAM:  Left breast s/p reconstruction, no swelling, sign of recurrence.  Right mastectomy site without nodularity or sign of recurrence, benign bilateral breast exam LUNGS:  Clear to auscultation bilaterally.  No wheezes or rhonchi. HEART:  Regular rate and rhythm. No murmur appreciated. ABDOMEN:  Soft, nontender.  Positive, normoactive bowel sounds. No organomegaly palpated. MSK:  No focal spinal tenderness to palpation. Full range of motion bilaterally in the  upper extremities. EXTREMITIES:  No peripheral edema.   SKIN:  Clear with no obvious rashes or skin changes. No nail dyscrasia. NEURO:  Nonfocal. Well oriented.  Appropriate affect. ECOG FS:1 - Symptomatic but completely ambulatory      LAB RESULTS:  CMP     Component Value Date/Time   NA 142 02/11/2014 1058   NA 142 01/21/2014 1137   K 4.1 02/11/2014 1058   K 4.2 01/21/2014 1137   CL 102 01/21/2014 1137   CO2 27 02/11/2014 1058   CO2 29 01/21/2014 1137   GLUCOSE 73 02/11/2014 1058   GLUCOSE 93 01/21/2014 1137   BUN 12.0 02/11/2014 1058   BUN 20 01/21/2014 1137   CREATININE 0.8 02/11/2014 1058   CREATININE 0.89 01/21/2014 1137   CALCIUM 9.2 02/11/2014 1058   CALCIUM 9.5 01/21/2014 1137   PROT 7.4 02/11/2014 1058   ALBUMIN 3.7 02/11/2014 1058   AST 16 02/11/2014 1058   ALT 11 02/11/2014 1058   ALKPHOS 68 02/11/2014 1058   BILITOT 0.35 02/11/2014 1058   GFRNONAA 57* 01/21/2014 1137   GFRAA 66* 01/21/2014 1137    I No results found for this basename: SPEP,  UPEP,   kappa and lambda light chains    Lab Results  Component Value Date   WBC 7.2 02/11/2014   NEUTROABS 3.9 02/11/2014   HGB 12.9 02/11/2014   HCT 39.1 02/11/2014   MCV 91.6 02/11/2014   PLT 237 02/11/2014      Chemistry      Component Value Date/Time   NA 142 02/11/2014 1058   NA 142 01/21/2014 1137   K 4.1 02/11/2014 1058   K 4.2 01/21/2014 1137   CL 102 01/21/2014 1137   CO2 27 02/11/2014 1058   CO2 29 01/21/2014 1137   BUN 12.0 02/11/2014 1058   BUN 20 01/21/2014 1137   CREATININE 0.8 02/11/2014 1058   CREATININE 0.89 01/21/2014 1137      Component Value Date/Time   CALCIUM 9.2 02/11/2014 1058   CALCIUM 9.5 01/21/2014 1137   ALKPHOS 68 02/11/2014 1058   AST 16  02/11/2014 1058   ALT 11 02/11/2014 1058   BILITOT 0.35 02/11/2014 1058       No results found for this basename: LABCA2    No components found with this basename: LABCA125    No results found for this basename: INR,  in the last 168 hours  Urinalysis    Component Value Date/Time    COLORURINE YELLOW 12/15/2010 2158   APPEARANCEUR CLOUDY* 12/15/2010 2158   LABSPEC 1.015 12/15/2010 2158   PHURINE 7.5 12/15/2010 2158   GLUCOSEU NEGATIVE 12/15/2010 2158   HGBUR NEGATIVE 12/15/2010 2158   BILIRUBINUR NEGATIVE 12/15/2010 2158   KETONESUR NEGATIVE 12/15/2010 2158   PROTEINUR NEGATIVE 12/15/2010 2158   UROBILINOGEN 0.2 12/15/2010 2158   NITRITE NEGATIVE 12/15/2010 2158   LEUKOCYTESUR NEGATIVE 12/15/2010 2158    STUDIES: Mammographic studies reviewed  ASSESSMENT: 80 y.o. [place] woman  (1) status post left modified radical mastectomy in 1989; receive tamoxifen adjuvantly for 10 years.  (2) status post right mastectomy and sentinel lymph node sampling 04/03/2013 for a pT2 pN0, stage IIA invasive ductal carcinoma, grade 2, estrogen receptor 100% positive, progesterone receptor negative, with an MIB-1 of 20%, and no HER-2 amplification.  (3) anastrozole started 05/22/2013  (4) denosumab to start 08/17/2013, to be repeated Q 6 months while on anastrozole  (5) BRCA 2 positivity, to undergo bilateral salpingo oophorectomy by Dr. Brewster on 02/23/14.  Patient has discussed her positivity with her brother and daughter and has recommended they undergo testing.   PLAN:  Rhonda Bryant is doing well today.  Her labs are normal.  I reviewed this with her in detail.  She has no sign of recurrence and is taking Anastrazole and tolerating it well.  I recommended she continue this.  I recommended healthy diet, exercise and monthly breast exams.  She tolerated prolia well last March and will proceed with this today.  I reviewed the recent developments with her ovarian cyst and upcoming surgery with Dr. Magrinat.  He would like to see her back in 2 months and this has been requested.  Also, the patient got a phone call from pharmacy at [facility] and stated that she couldn't understand the message.  I called Brian at  pharmacy and he is going to look into this and call me back.   We will see Rhonda Bryant  in 2 months.   She knows to call us in the interim for any questions or concerns.  We can certainly see her sooner if needed.   I spent 25 minutes counseling the patient face to face.  The total time spent in the appointment was 30 minutes.  Lindsey N. Cornetto, NP Medical Oncology [facility] [phone] 02/11/2014 2:45 PM 

## 2014-02-11 NOTE — Patient Instructions (Signed)
You are doing well.  You have no sign of breast cancer recurrence.  Continue Anastrazole daily.  I recommend healthy diet, exercise, breast exams.    Breast Self-Awareness Practicing breast self-awareness may pick up problems early, prevent significant medical complications, and possibly save your life. By practicing breast self-awareness, you can become familiar with how your breasts look and feel and if your breasts are changing. This allows you to notice changes early. It can also offer you some reassurance that your breast health is good. One way to learn what is normal for your breasts and whether your breasts are changing is to do a breast self-exam. If you find a lump or something that was not present in the past, it is best to contact your caregiver right away. Other findings that should be evaluated by your caregiver include nipple discharge, especially if it is bloody; skin changes or reddening; areas where the skin seems to be pulled in (retracted); or new lumps and bumps. Breast pain is seldom associated with cancer (malignancy), but should also be evaluated by a caregiver. HOW TO PERFORM A BREAST SELF-EXAM The best time to examine your breasts is 5-7 days after your menstrual period is over. During menstruation, the breasts are lumpier, and it may be more difficult to pick up changes. If you do not menstruate, have reached menopause, or had your uterus removed (hysterectomy), you should examine your breasts at regular intervals, such as monthly. If you are breastfeeding, examine your breasts after a feeding or after using a breast pump. Breast implants do not decrease the risk for lumps or tumors, so continue to perform breast self-exams as recommended. Talk to your caregiver about how to determine the difference between the implant and breast tissue. Also, talk about the amount of pressure you should use during the exam. Over time, you will become more familiar with the variations of your breasts  and more comfortable with the exam. A breast self-exam requires you to remove all your clothes above the waist. 1. Look at your breasts and nipples. Stand in front of a mirror in a room with good lighting. With your hands on your hips, push your hands firmly downward. Look for a difference in shape, contour, and size from one breast to the other (asymmetry). Asymmetry includes puckers, dips, or bumps. Also, look for skin changes, such as reddened or scaly areas on the breasts. Look for nipple changes, such as discharge, dimpling, repositioning, or redness. 2. Carefully feel your breasts. This is best done either in the shower or tub while using soapy water or when flat on your back. Place the arm (on the side of the breast you are examining) above your head. Use the pads (not the fingertips) of your three middle fingers on your opposite hand to feel your breasts. Start in the underarm area and use  inch (2 cm) overlapping circles to feel your breast. Use 3 different levels of pressure (light, medium, and firm pressure) at each circle before moving to the next circle. The light pressure is needed to feel the tissue closest to the skin. The medium pressure will help to feel breast tissue a little deeper, while the firm pressure is needed to feel the tissue close to the ribs. Continue the overlapping circles, moving downward over the breast until you feel your ribs below your breast. Then, move one finger-width towards the center of the body. Continue to use the  inch (2 cm) overlapping circles to feel your breast as you  move slowly up toward the collar bone (clavicle) near the base of the neck. Continue the up and down exam using all 3 pressures until you reach the middle of the chest. Do this with each breast, carefully feeling for lumps or changes. 3.  Keep a written record with breast changes or normal findings for each breast. By writing this information down, you do not need to depend only on memory for  size, tenderness, or location. Write down where you are in your menstrual cycle, if you are still menstruating. Breast tissue can have some lumps or thick tissue. However, see your caregiver if you find anything that concerns you.  SEEK MEDICAL CARE IF:  You see a change in shape, contour, or size of your breasts or nipples.   You see skin changes, such as reddened or scaly areas on the breasts or nipples.   You have an unusual discharge from your nipples.   You feel a new lump or unusually thick areas.  Document Released: 05/28/2005 Document Revised: 05/14/2012 Document Reviewed: 09/12/2011 [facility] Patient Information 2015 [place], [place]. This information is not intended to replace advice given to you by your health care provider. Make sure you discuss any questions you have with your health care provider.

## 2014-02-16 ENCOUNTER — Encounter (HOSPITAL_COMMUNITY): Payer: Self-pay | Admitting: Pharmacy Technician

## 2014-02-16 ENCOUNTER — Telehealth: Payer: Self-pay | Admitting: Oncology

## 2014-02-16 NOTE — Telephone Encounter (Signed)
s.w. pt and advised on NOV appt....pt ok and aware °

## 2014-02-19 ENCOUNTER — Encounter (HOSPITAL_COMMUNITY)
Admission: RE | Admit: 2014-02-19 | Discharge: 2014-02-19 | Disposition: A | Discharge status: Home / Self Care | Payer: Medicare Other | Source: Ambulatory Visit | Attending: Gynecologic Oncology | Admitting: Gynecologic Oncology

## 2014-02-19 ENCOUNTER — Ambulatory Visit (HOSPITAL_COMMUNITY)
Admission: RE | Admit: 2014-02-19 | Discharge: 2014-02-19 | Disposition: A | Discharge status: Home / Self Care | Payer: Medicare Other | Source: Ambulatory Visit | Attending: Anesthesiology | Admitting: Anesthesiology

## 2014-02-19 ENCOUNTER — Encounter (HOSPITAL_COMMUNITY): Payer: Self-pay

## 2014-02-19 DIAGNOSIS — Z853 Personal history of malignant neoplasm of breast: Secondary | ICD-10-CM | POA: Insufficient documentation

## 2014-02-19 DIAGNOSIS — Z01818 Encounter for other preprocedural examination: Secondary | ICD-10-CM | POA: Diagnosis present

## 2014-02-19 HISTORY — DX: Diplopia: H53.2

## 2014-02-19 HISTORY — DX: Effusion, unspecified ankle: M25.473

## 2014-02-19 HISTORY — DX: Unspecified asthma, uncomplicated: J45.909

## 2014-02-19 HISTORY — DX: Headache: R51

## 2014-02-19 LAB — CBC WITH DIFFERENTIAL/PLATELET
Basophils Absolute: 0.1 10*3/uL (ref 0.0–0.1)
Basophils Relative: 1 % (ref 0–1)
EOS PCT: 2 % (ref 0–5)
Eosinophils Absolute: 0.2 10*3/uL (ref 0.0–0.7)
HEMATOCRIT: 42.5 % (ref 36.0–46.0)
Hemoglobin: 13.9 g/dL (ref 12.0–15.0)
LYMPHS PCT: 33 % (ref 12–46)
Lymphs Abs: 2.5 10*3/uL (ref 0.7–4.0)
MCH: 30.2 pg (ref 26.0–34.0)
MCHC: 32.7 g/dL (ref 30.0–36.0)
MCV: 92.4 fL (ref 78.0–100.0)
MONOS PCT: 8 % (ref 3–12)
Monocytes Absolute: 0.6 10*3/uL (ref 0.1–1.0)
Neutro Abs: 4.3 10*3/uL (ref 1.7–7.7)
Neutrophils Relative %: 56 % (ref 43–77)
Platelets: 263 10*3/uL (ref 150–400)
RBC: 4.6 MIL/uL (ref 3.87–5.11)
RDW: 13.4 % (ref 11.5–15.5)
WBC: 7.6 10*3/uL (ref 4.0–10.5)

## 2014-02-19 LAB — COMPREHENSIVE METABOLIC PANEL
ALT: 12 U/L (ref 0–35)
AST: 21 U/L (ref 0–37)
Albumin: 4.3 g/dL (ref 3.5–5.2)
Alkaline Phosphatase: 73 U/L (ref 39–117)
Anion gap: 14 (ref 5–15)
BUN: 12 mg/dL (ref 6–23)
CO2: 28 meq/L (ref 19–32)
CREATININE: 0.72 mg/dL (ref 0.50–1.10)
Calcium: 10.2 mg/dL (ref 8.4–10.5)
Chloride: 99 mEq/L (ref 96–112)
GFR, EST AFRICAN AMERICAN: 87 mL/min — AB (ref >90)
GFR, EST NON AFRICAN AMERICAN: 75 mL/min — AB (ref >90)
Glucose, Bld: 90 mg/dL (ref 70–99)
Potassium: 4.5 mEq/L (ref 3.7–5.3)
SODIUM: 141 meq/L (ref 137–147)
Total Bilirubin: 0.3 mg/dL (ref 0.3–1.2)
Total Protein: 8.3 g/dL (ref 6.0–8.3)

## 2014-02-19 LAB — URINALYSIS, ROUTINE W REFLEX MICROSCOPIC
BILIRUBIN URINE: NEGATIVE
Glucose, UA: NEGATIVE mg/dL
HGB URINE DIPSTICK: NEGATIVE
Ketones, ur: NEGATIVE mg/dL
Leukocytes, UA: NEGATIVE
NITRITE: NEGATIVE
PROTEIN: NEGATIVE mg/dL
Specific Gravity, Urine: 1.007 (ref 1.005–1.030)
UROBILINOGEN UA: 0.2 mg/dL (ref 0.0–1.0)
pH: 7.5 (ref 5.0–8.0)

## 2014-02-19 NOTE — Patient Instructions (Addendum)
[place]  02/19/2014   Your procedure is scheduled on: Tuesday 02/23/14  Report to [facility] at 05:30 AM.  Call this number if you have problems the morning of surgery [phone]: [phone]   Remember:Please bring inhaler on day of surgery.   Clear liquids for 24 hours before surgery.   Do not eat food or drink liquids After Midnight.     Take these medicines the morning of surgery with A SIP OF WATER: inhaler if needed, alprazolam, norvasc, loratadine, prilosec   No vitamins or herbal supplements before surgery.   Do not wear jewelry, make-up or nail polish.  Do not wear lotions, powders, or perfumes. You may wear deodorant.  Do not shave 48 hours prior to surgery. Men may shave face and neck.  Do not bring valuables to the hospital.  Contacts, dentures or bridgework may not be worn into surgery.    Patients discharged the day of surgery will not be allowed to drive home.  Name and phone number of your driver: Rhonda Bryant [phone]   Rhonda Blanch, RN  pre op nurse call if needed [phone]    [facility] - Preparing for Surgery Before surgery, you can play an important role.  Because skin is not sterile, your skin needs to be as free of germs as possible.  You can reduce the number of germs on your skin by washing with CHG (chlorahexidine gluconate) soap before surgery.  CHG is an antiseptic cleaner which kills germs and bonds with the skin to continue killing germs even after washing. Please DO NOT use if you have an allergy to CHG or antibacterial soaps.  If your skin becomes reddened/irritated stop using the CHG and inform your nurse when you arrive at Short Stay. Do not shave (including legs and underarms) for at least 48 hours prior to the first CHG shower.  You may shave your face/neck. Please follow these instructions carefully:  1.  Shower with CHG Soap the night before surgery and the  morning of Surgery.  2.  If you choose to wash your hair, wash your  hair first as usual with your  normal  shampoo.  3.  After you shampoo, rinse your hair and body thoroughly to remove the  shampoo.                            4.  Use CHG as you would any other liquid soap.  You can apply chg directly  to the skin and wash                       Gently with a scrungie or clean washcloth.  5.  Apply the CHG Soap to your body ONLY FROM THE NECK DOWN.   Do not use on face/ open                           Wound or open sores. Avoid contact with eyes, ears mouth and genitals (private parts).                       Wash face,  Genitals (private parts) with your normal soap.             6.  Wash thoroughly, paying special attention to the area where your surgery  will be performed.  7.  Thoroughly rinse your  body with warm water from the neck down.  8.  DO NOT shower/wash with your normal soap after using and rinsing off  the CHG Soap.                9.  Pat yourself dry with a clean towel.            10.  Wear clean pajamas.            11.  Place clean sheets on your bed the night of your first shower and do not  sleep with pets. Day of Surgery : Do not apply any lotions the morning of surgery.  Please wear clean clothes to the hospital/surgery center.  FAILURE TO FOLLOW THESE INSTRUCTIONS MAY RESULT IN THE CANCELLATION OF YOUR SURGERY PATIENT SIGNATURE_________________________________  NURSE SIGNATURE__________________________________  ________________________________________________________________________  WHAT IS A BLOOD TRANSFUSION? Blood Transfusion Information  A transfusion is the replacement of blood or some of its parts. Blood is made up of multiple cells which provide different functions.  Red blood cells carry oxygen and are used for blood loss replacement.  White blood cells fight against infection.  Platelets control bleeding.  Plasma helps clot blood.  Other blood products are available for specialized needs, such as hemophilia or other  clotting disorders. BEFORE THE TRANSFUSION  Who gives blood for transfusions?   Healthy volunteers who are fully evaluated to make sure their blood is safe. This is blood bank blood. Transfusion therapy is the safest it has ever been in the practice of medicine. Before blood is taken from a donor, a complete history is taken to make sure that person has no history of diseases nor engages in risky social behavior (examples are intravenous drug use or sexual activity with multiple partners). The donor's travel history is screened to minimize risk of transmitting infections, such as malaria. The donated blood is tested for signs of infectious diseases, such as HIV and hepatitis. The blood is then tested to be sure it is compatible with you in order to minimize the chance of a transfusion reaction. If you or a relative donates blood, this is often done in anticipation of surgery and is not appropriate for emergency situations. It takes many days to process the donated blood. RISKS AND COMPLICATIONS Although transfusion therapy is very safe and saves many lives, the main dangers of transfusion include:   Getting an infectious disease.  Developing a transfusion reaction. This is an allergic reaction to something in the blood you were given. Every precaution is taken to prevent this. The decision to have a blood transfusion has been considered carefully by your caregiver before blood is given. Blood is not given unless the benefits outweigh the risks. AFTER THE TRANSFUSION  Right after receiving a blood transfusion, you will usually feel much better and more energetic. This is especially true if your red blood cells have gotten low (anemic). The transfusion raises the level of the red blood cells which carry oxygen, and this usually causes an energy increase.  The nurse administering the transfusion will monitor you carefully for complications. HOME CARE INSTRUCTIONS  No special instructions are needed  after a transfusion. You may find your energy is better. Speak with your caregiver about any limitations on activity for underlying diseases you may have. SEEK MEDICAL CARE IF:   Your condition is not improving after your transfusion.  You develop redness or irritation at the intravenous (IV) site. SEEK IMMEDIATE MEDICAL CARE IF:  Any of the following symptoms  occur over the next 12 hours:  Shaking chills.  You have a temperature by mouth above 102 F (38.9 C), not controlled by medicine.  Chest, back, or muscle pain.  People around you feel you are not acting correctly or are confused.  Shortness of breath or difficulty breathing.  Dizziness and fainting.  You get a rash or develop hives.  You have a decrease in urine output.  Your urine turns a dark color or changes to pink, red, or brown. Any of the following symptoms occur over the next 10 days:  You have a temperature by mouth above 102 F (38.9 C), not controlled by medicine.  Shortness of breath.  Weakness after normal activity.  The white part of the eye turns yellow (jaundice).  You have a decrease in the amount of urine or are urinating less often.  Your urine turns a dark color or changes to pink, red, or brown. Document Released: 05/25/2000 Document Revised: 08/20/2011 Document Reviewed: 01/12/2008 Exit[employer] Patient Information 2014 [employer], [place].  _______________________________________________________________________   CLEAR LIQUID DIET   Foods Allowed                                                                     Foods Excluded  Coffee and tea, regular and decaf                             liquids that you cannot  Plain Jell-O in any flavor                                             see through such as: Fruit ices (not with fruit pulp)                                     milk, soups, orange juice  Iced Popsicles                                    All solid food Carbonated beverages,  regular and diet                                    Cranberry, grape and apple juices Sports drinks like Gatorade Lightly seasoned clear broth or consume(fat free) Sugar, honey syrup  Sample Menu Breakfast                                Lunch                                     Supper Cranberry juice                    Beef broth  Chicken broth Jell-O                                     Grape juice                           Apple juice Coffee or tea                        Jell-O                                      Popsicle                                                Coffee or tea                        Coffee or tea  _____________________________________________________________________    Incentive Spirometer  An incentive spirometer is a tool that can help keep your lungs clear and active. This tool measures how well you are filling your lungs with each breath. Taking long deep breaths may help reverse or decrease the chance of developing breathing (pulmonary) problems (especially infection) following:  A long period of time when you are unable to move or be active. BEFORE THE PROCEDURE   If the spirometer includes an indicator to show your best effort, your nurse or respiratory therapist will set it to a desired goal.  If possible, sit up straight or lean slightly forward. Try not to slouch.  Hold the incentive spirometer in an upright position. INSTRUCTIONS FOR USE  1. Sit on the edge of your bed if possible, or sit up as far as you can in bed or on a chair. 2. Hold the incentive spirometer in an upright position. 3. Breathe out normally. 4. Place the mouthpiece in your mouth and seal your lips tightly around it. 5. Breathe in slowly and as deeply as possible, raising the piston or the ball toward the top of the column. 6. Hold your breath for 3-5 seconds or for as long as possible. Allow the piston or ball to fall to the bottom of the  column. 7. Remove the mouthpiece from your mouth and breathe out normally. 8. Rest for a few seconds and repeat Steps 1 through 7 at least 10 times every 1-2 hours when you are awake. Take your time and take a few normal breaths between deep breaths. 9. The spirometer may include an indicator to show your best effort. Use the indicator as a goal to work toward during each repetition. 10. After each set of 10 deep breaths, practice coughing to be sure your lungs are clear. If you have an incision (the cut made at the time of surgery), support your incision when coughing by placing a pillow or rolled up towels firmly against it. Once you are able to get out of bed, walk around indoors and cough well. You may stop using the incentive spirometer when instructed by your caregiver.  RISKS AND COMPLICATIONS  Take your time so you do not get dizzy or light-headed.  If you are in pain, you  may need to take or ask for pain medication before doing incentive spirometry. It is harder to take a deep breath if you are having pain. AFTER USE  Rest and breathe slowly and easily.  It can be helpful to keep track of a log of your progress. Your caregiver can provide you with a simple table to help with this. If you are using the spirometer at home, follow these instructions: [place] IF:   You are having difficultly using the spirometer.  You have trouble using the spirometer as often as instructed.  Your pain medication is not giving enough relief while using the spirometer.  You develop fever of 100.5 F (38.1 C) or higher. SEEK IMMEDIATE MEDICAL CARE IF:   You cough up bloody sputum that had not been present before.  You develop fever of 102 F (38.9 C) or greater.  You develop worsening pain at or near the incision site. MAKE SURE YOU:   Understand these instructions.  Will watch your condition.  Will get help right away if you are not doing well or get worse. Document Released:  10/08/2006 Document Revised: 08/20/2011 Document Reviewed: 12/09/2006 [facility] Patient Information 2014 [place], [place].   ________________________________________________________________________

## 2014-02-19 NOTE — Progress Notes (Signed)
EKG 01/21/14 on EPIC

## 2014-02-22 ENCOUNTER — Telehealth: Payer: Self-pay | Admitting: Gynecologic Oncology

## 2014-02-22 NOTE — Telephone Encounter (Signed)
Telephone call to check on pre-operative status.  Patient complaint with pre-operative instructions.  Reinforced NPO after midnight.  No questions or concerns voiced.  Instructed to call for any needs.

## 2014-02-22 NOTE — Anesthesia Preprocedure Evaluation (Addendum)
Anesthesia Evaluation  Patient identified by MRN, date of birth, ID band Patient awake    Reviewed: Allergy & Precautions, H&P , NPO status , Patient's Chart, lab work & pertinent test results  History of Anesthesia Complications (+) PONV  Airway Mallampati: II TM Distance: >3 FB Neck ROM: full    Dental no notable dental hx. (+) Teeth Intact, Dental Advisory Given   Pulmonary neg pulmonary ROS, shortness of breath and with exertion, asthma , COPDformer smoker,  breath sounds clear to auscultation  Pulmonary exam normal       Cardiovascular Exercise Tolerance: Good hypertension, Pt. on medications negative cardio ROS  Rhythm:regular Rate:Normal     Neuro/Psych negative neurological ROS  negative psych ROS   GI/Hepatic negative GI ROS, Neg liver ROS, GERD-  Medicated and Controlled,  Endo/Other  negative endocrine ROS  Renal/GU negative Renal ROS  negative genitourinary   Musculoskeletal   Abdominal   Peds  Hematology negative hematology ROS (+)   Anesthesia Other Findings   Reproductive/Obstetrics negative OB ROS                          Anesthesia Physical Anesthesia Plan  ASA: III  Anesthesia Plan: General   Post-op Pain Management:    Induction: Intravenous  Airway Management Planned: Oral ETT  Additional Equipment:   Intra-op Plan:   Post-operative Plan: Extubation in OR  Informed Consent: I have reviewed the patients History and Physical, chart, labs and discussed the procedure including the risks, benefits and alternatives for the proposed anesthesia with the patient or authorized representative who has indicated his/her understanding and acceptance.   Dental Advisory Given  Plan Discussed with: CRNA and Surgeon  Anesthesia Plan Comments:         Anesthesia Quick Evaluation

## 2014-02-23 ENCOUNTER — Encounter (HOSPITAL_COMMUNITY): Payer: Medicare Other | Admitting: Anesthesiology

## 2014-02-23 ENCOUNTER — Ambulatory Visit (HOSPITAL_COMMUNITY): Payer: Medicare Other | Admitting: Anesthesiology

## 2014-02-23 ENCOUNTER — Ambulatory Visit (HOSPITAL_COMMUNITY)
Admission: RE | Admit: 2014-02-23 | Discharge: 2014-02-23 | Disposition: A | Discharge status: Home / Self Care | Payer: Medicare Other | Source: Ambulatory Visit | Attending: Gynecologic Oncology | Admitting: Gynecologic Oncology

## 2014-02-23 ENCOUNTER — Encounter (HOSPITAL_COMMUNITY)
Admission: RE | Disposition: A | Discharge status: Home / Self Care | Payer: Self-pay | Source: Ambulatory Visit | Attending: Gynecologic Oncology

## 2014-02-23 ENCOUNTER — Encounter (HOSPITAL_COMMUNITY): Payer: Self-pay | Admitting: *Deleted

## 2014-02-23 DIAGNOSIS — N83209 Unspecified ovarian cyst, unspecified side: Secondary | ICD-10-CM | POA: Insufficient documentation

## 2014-02-23 DIAGNOSIS — Z9010 Acquired absence of unspecified breast and nipple: Secondary | ICD-10-CM | POA: Diagnosis not present

## 2014-02-23 DIAGNOSIS — Z885 Allergy status to narcotic agent status: Secondary | ICD-10-CM | POA: Diagnosis not present

## 2014-02-23 DIAGNOSIS — F411 Generalized anxiety disorder: Secondary | ICD-10-CM | POA: Insufficient documentation

## 2014-02-23 DIAGNOSIS — Z9109 Other allergy status, other than to drugs and biological substances: Secondary | ICD-10-CM | POA: Insufficient documentation

## 2014-02-23 DIAGNOSIS — J449 Chronic obstructive pulmonary disease, unspecified: Secondary | ICD-10-CM | POA: Diagnosis not present

## 2014-02-23 DIAGNOSIS — R0602 Shortness of breath: Secondary | ICD-10-CM | POA: Insufficient documentation

## 2014-02-23 DIAGNOSIS — Z882 Allergy status to sulfonamides status: Secondary | ICD-10-CM | POA: Diagnosis not present

## 2014-02-23 DIAGNOSIS — I10 Essential (primary) hypertension: Secondary | ICD-10-CM | POA: Diagnosis not present

## 2014-02-23 DIAGNOSIS — M810 Age-related osteoporosis without current pathological fracture: Secondary | ICD-10-CM | POA: Insufficient documentation

## 2014-02-23 DIAGNOSIS — Z9089 Acquired absence of other organs: Secondary | ICD-10-CM | POA: Insufficient documentation

## 2014-02-23 DIAGNOSIS — F329 Major depressive disorder, single episode, unspecified: Secondary | ICD-10-CM | POA: Insufficient documentation

## 2014-02-23 DIAGNOSIS — Z9849 Cataract extraction status, unspecified eye: Secondary | ICD-10-CM | POA: Insufficient documentation

## 2014-02-23 DIAGNOSIS — N838 Other noninflammatory disorders of ovary, fallopian tube and broad ligament: Secondary | ICD-10-CM | POA: Insufficient documentation

## 2014-02-23 DIAGNOSIS — N83202 Unspecified ovarian cyst, left side: Secondary | ICD-10-CM

## 2014-02-23 DIAGNOSIS — Z888 Allergy status to other drugs, medicaments and biological substances status: Secondary | ICD-10-CM | POA: Insufficient documentation

## 2014-02-23 DIAGNOSIS — N9489 Other specified conditions associated with female genital organs and menstrual cycle: Secondary | ICD-10-CM | POA: Insufficient documentation

## 2014-02-23 DIAGNOSIS — F3289 Other specified depressive episodes: Secondary | ICD-10-CM | POA: Diagnosis not present

## 2014-02-23 DIAGNOSIS — N839 Noninflammatory disorder of ovary, fallopian tube and broad ligament, unspecified: Secondary | ICD-10-CM | POA: Diagnosis present

## 2014-02-23 DIAGNOSIS — J4489 Other specified chronic obstructive pulmonary disease: Secondary | ICD-10-CM | POA: Insufficient documentation

## 2014-02-23 HISTORY — PX: ROBOTIC ASSISTED BILATERAL SALPINGO OOPHERECTOMY: SHX6078

## 2014-02-23 LAB — TYPE AND SCREEN
ABO/RH(D): A NEG
Antibody Screen: NEGATIVE

## 2014-02-23 LAB — ABO/RH: ABO/RH(D): A NEG

## 2014-02-23 SURGERY — ROBOTIC ASSISTED BILATERAL SALPINGO OOPHORECTOMY
Anesthesia: General | Laterality: Bilateral

## 2014-02-23 MED ORDER — DEXTROSE 5 % IV SOLN
INTRAVENOUS | Status: AC
  Filled 2014-02-23: qty 2

## 2014-02-23 MED ORDER — GLYCOPYRROLATE 0.2 MG/ML IJ SOLN
INTRAMUSCULAR | Status: DC | PRN
  Administered 2014-02-23: 0.2 mg via INTRAVENOUS
  Administered 2014-02-23: .4 mg via INTRAVENOUS

## 2014-02-23 MED ORDER — HYDROMORPHONE HCL PF 1 MG/ML IJ SOLN
0.2500 mg | INTRAMUSCULAR | Status: DC | PRN
  Administered 2014-02-23 (×2): 0.5 mg via INTRAVENOUS

## 2014-02-23 MED ORDER — FENTANYL CITRATE 0.05 MG/ML IJ SOLN
INTRAMUSCULAR | Status: AC
  Filled 2014-02-23: qty 5

## 2014-02-23 MED ORDER — BUPIVACAINE-EPINEPHRINE (PF) 0.25% -1:200000 IJ SOLN
INTRAMUSCULAR | Status: AC
  Filled 2014-02-23: qty 30

## 2014-02-23 MED ORDER — LACTATED RINGERS IV SOLN
INTRAVENOUS | Status: DC | PRN
  Administered 2014-02-23 (×3): via INTRAVENOUS

## 2014-02-23 MED ORDER — FENTANYL CITRATE 0.05 MG/ML IJ SOLN
INTRAMUSCULAR | Status: AC
Start: 2014-02-23 — End: 2014-02-23
  Filled 2014-02-23: qty 5

## 2014-02-23 MED ORDER — CEFOTETAN DISODIUM-DEXTROSE 2-2.08 GM-% IV SOLR
INTRAVENOUS | Status: AC
  Filled 2014-02-23: qty 50

## 2014-02-23 MED ORDER — CISATRACURIUM BESYLATE 20 MG/10ML IV SOLN
INTRAVENOUS | Status: AC
  Filled 2014-02-23: qty 10

## 2014-02-23 MED ORDER — ONDANSETRON HCL 4 MG/2ML IJ SOLN
INTRAMUSCULAR | Status: DC | PRN
  Administered 2014-02-23 (×2): 2 mg via INTRAVENOUS

## 2014-02-23 MED ORDER — NEOSTIGMINE METHYLSULFATE 10 MG/10ML IV SOLN
INTRAVENOUS | Status: DC | PRN
  Administered 2014-02-23: 4 mg via INTRAVENOUS

## 2014-02-23 MED ORDER — PROPOFOL 10 MG/ML IV BOLUS
INTRAVENOUS | Status: AC
  Filled 2014-02-23: qty 20

## 2014-02-23 MED ORDER — PROMETHAZINE HCL 25 MG/ML IJ SOLN
6.2500 mg | INTRAMUSCULAR | Status: DC | PRN
  Administered 2014-02-23: 6.25 mg via INTRAVENOUS

## 2014-02-23 MED ORDER — NEOSTIGMINE METHYLSULFATE 10 MG/10ML IV SOLN
INTRAVENOUS | Status: AC
  Filled 2014-02-23: qty 1

## 2014-02-23 MED ORDER — CEFAZOLIN SODIUM-DEXTROSE 2-3 GM-% IV SOLR
INTRAVENOUS | Status: AC
  Filled 2014-02-23: qty 50

## 2014-02-23 MED ORDER — LACTATED RINGERS IR SOLN
Status: DC | PRN
  Administered 2014-02-23: 1000 mL

## 2014-02-23 MED ORDER — LACTATED RINGERS IV SOLN: INTRAVENOUS | Status: DC

## 2014-02-23 MED ORDER — ONDANSETRON HCL 4 MG/2ML IJ SOLN
INTRAMUSCULAR | Status: AC
  Filled 2014-02-23: qty 2

## 2014-02-23 MED ORDER — EPHEDRINE SULFATE 50 MG/ML IJ SOLN
INTRAMUSCULAR | Status: DC | PRN
  Administered 2014-02-23: 5 mg via INTRAVENOUS

## 2014-02-23 MED ORDER — EPHEDRINE SULFATE 50 MG/ML IJ SOLN
INTRAMUSCULAR | Status: AC
  Filled 2014-02-23: qty 1

## 2014-02-23 MED ORDER — PROPOFOL 10 MG/ML IV BOLUS
INTRAVENOUS | Status: DC | PRN
  Administered 2014-02-23: 100 mg via INTRAVENOUS
  Administered 2014-02-23: 50 mg via INTRAVENOUS

## 2014-02-23 MED ORDER — HYDROMORPHONE HCL PF 1 MG/ML IJ SOLN
INTRAMUSCULAR | Status: AC
  Filled 2014-02-23: qty 1

## 2014-02-23 MED ORDER — CISATRACURIUM BESYLATE (PF) 10 MG/5ML IV SOLN
INTRAVENOUS | Status: DC | PRN
  Administered 2014-02-23: 8 mg via INTRAVENOUS

## 2014-02-23 MED ORDER — GLYCOPYRROLATE 0.2 MG/ML IJ SOLN
INTRAMUSCULAR | Status: AC
  Filled 2014-02-23: qty 3

## 2014-02-23 MED ORDER — CEFAZOLIN SODIUM-DEXTROSE 2-3 GM-% IV SOLR
2.0000 g | INTRAVENOUS | Status: AC
  Administered 2014-02-23: 2 g via INTRAVENOUS

## 2014-02-23 MED ORDER — ACETAMINOPHEN 10 MG/ML IV SOLN
1000.0000 mg | Freq: Once | INTRAVENOUS | Status: AC
  Administered 2014-02-23: 1000 mg via INTRAVENOUS
  Filled 2014-02-23: qty 100

## 2014-02-23 MED ORDER — STERILE WATER FOR IRRIGATION IR SOLN
Status: DC | PRN
  Administered 2014-02-23: 3000 mL

## 2014-02-23 MED ORDER — OXYCODONE-ACETAMINOPHEN 5-325 MG PO TABS: 1.0000 | ORAL_TABLET | Freq: Four times a day (QID) | ORAL | Status: DC | PRN

## 2014-02-23 MED ORDER — SUCCINYLCHOLINE CHLORIDE 20 MG/ML IJ SOLN
INTRAMUSCULAR | Status: DC | PRN
  Administered 2014-02-23: 80 mg via INTRAVENOUS

## 2014-02-23 MED ORDER — DEXAMETHASONE SODIUM PHOSPHATE 10 MG/ML IJ SOLN
INTRAMUSCULAR | Status: AC
  Filled 2014-02-23: qty 1

## 2014-02-23 MED ORDER — FENTANYL CITRATE 0.05 MG/ML IJ SOLN
INTRAMUSCULAR | Status: DC | PRN
  Administered 2014-02-23 (×4): 50 ug via INTRAVENOUS

## 2014-02-23 MED ORDER — HEPARIN SODIUM (PORCINE) 1000 UNIT/ML IJ SOLN
INTRAMUSCULAR | Status: AC
  Filled 2014-02-23: qty 1

## 2014-02-23 MED ORDER — PROMETHAZINE HCL 25 MG/ML IJ SOLN
INTRAMUSCULAR | Status: AC
  Filled 2014-02-23: qty 1

## 2014-02-23 MED ORDER — MIDAZOLAM HCL 2 MG/2ML IJ SOLN
INTRAMUSCULAR | Status: AC
  Filled 2014-02-23: qty 2

## 2014-02-23 MED ORDER — ATROPINE SULFATE 0.4 MG/ML IJ SOLN
INTRAMUSCULAR | Status: AC
  Filled 2014-02-23: qty 1

## 2014-02-23 MED ORDER — DEXAMETHASONE SODIUM PHOSPHATE 10 MG/ML IJ SOLN
INTRAMUSCULAR | Status: DC | PRN
  Administered 2014-02-23: 10 mg via INTRAVENOUS

## 2014-02-23 MED ORDER — BUPIVACAINE-EPINEPHRINE (PF) 0.25% -1:200000 IJ SOLN
INTRAMUSCULAR | Status: DC | PRN
  Administered 2014-02-23: 10 mL

## 2014-02-23 MED ORDER — SODIUM CHLORIDE 0.9 % IJ SOLN
INTRAMUSCULAR | Status: AC
  Filled 2014-02-23: qty 10

## 2014-02-23 MED ORDER — LIDOCAINE HCL (CARDIAC) 20 MG/ML IV SOLN
INTRAVENOUS | Status: DC | PRN
  Administered 2014-02-23: 50 mg via INTRAVENOUS

## 2014-02-23 MED ORDER — LIDOCAINE HCL (CARDIAC) 20 MG/ML IV SOLN
INTRAVENOUS | Status: AC
  Filled 2014-02-23: qty 5

## 2014-02-23 MED ORDER — CEFOTETAN DISODIUM-DEXTROSE 2-2.08 GM-% IV SOLR: 2.0000 g | INTRAVENOUS | Status: DC

## 2014-02-23 SURGICAL SUPPLY — 60 items
ADH SKN CLS APL DERMABOND .7 (GAUZE/BANDAGES/DRESSINGS) ×1
APL SKNCLS STERI-STRIP NONHPOA (GAUZE/BANDAGES/DRESSINGS) ×1
BAG SPEC RTRVL LRG 6X4 10 (ENDOMECHANICALS) ×2
BENZOIN TINCTURE PRP APPL 2/3 (GAUZE/BANDAGES/DRESSINGS) ×3 IMPLANT
CHLORAPREP W/TINT 26ML (MISCELLANEOUS) ×3 IMPLANT
CLOSURE WOUND 1/2 X4 (GAUZE/BANDAGES/DRESSINGS) ×1
CORD HIGH FREQUENCY UNIPOLAR (ELECTROSURGICAL) ×3 IMPLANT
CORDS BIPOLAR (ELECTRODE) ×3 IMPLANT
COVER SURGICAL LIGHT HANDLE (MISCELLANEOUS) ×3 IMPLANT
COVER TIP SHEARS 8 DVNC (MISCELLANEOUS) ×1 IMPLANT
COVER TIP SHEARS 8MM DA VINCI (MISCELLANEOUS) ×2
DECANTER SPIKE VIAL GLASS SM (MISCELLANEOUS) ×3 IMPLANT
DERMABOND ADVANCED (GAUZE/BANDAGES/DRESSINGS) ×2
DERMABOND ADVANCED .7 DNX12 (GAUZE/BANDAGES/DRESSINGS) IMPLANT
DRAPE LG THREE QUARTER DISP (DRAPES) ×6 IMPLANT
DRAPE SURG IRRIG POUCH 19X23 (DRAPES) ×3 IMPLANT
DRAPE TABLE BACK 44X90 PK DISP (DRAPES) ×6 IMPLANT
DRAPE UTILITY XL STRL (DRAPES) ×3 IMPLANT
DRAPE WARM FLUID 44X44 (DRAPE) ×3 IMPLANT
DRSG TEGADERM 2-3/8X2-3/4 SM (GAUZE/BANDAGES/DRESSINGS) ×3 IMPLANT
DRSG TEGADERM 4X4.75 (GAUZE/BANDAGES/DRESSINGS) ×3 IMPLANT
DRSG TEGADERM 6X8 (GAUZE/BANDAGES/DRESSINGS) ×6 IMPLANT
ELECT REM PT RETURN 9FT ADLT (ELECTROSURGICAL) ×3
ELECTRODE REM PT RTRN 9FT ADLT (ELECTROSURGICAL) ×1 IMPLANT
GAUZE SPONGE 2X2 8PLY STRL LF (GAUZE/BANDAGES/DRESSINGS) ×2 IMPLANT
GLOVE BIO SURGEON STRL SZ 6.5 (GLOVE) ×8 IMPLANT
GLOVE BIO SURGEON STRL SZ7.5 (GLOVE) ×6 IMPLANT
GLOVE BIO SURGEONS STRL SZ 6.5 (GLOVE) ×4
GLOVE BIOGEL PI IND STRL 7.0 (GLOVE) ×2 IMPLANT
GLOVE BIOGEL PI INDICATOR 7.0 (GLOVE) ×4
GOWN STRL REUS W/ TWL XL LVL3 (GOWN DISPOSABLE) ×5 IMPLANT
GOWN STRL REUS W/TWL XL LVL3 (GOWN DISPOSABLE) ×15
HOLDER FOLEY CATH W/STRAP (MISCELLANEOUS) ×3 IMPLANT
KIT ACCESSORY DA VINCI DISP (KITS) ×2
KIT ACCESSORY DVNC DISP (KITS) ×1 IMPLANT
MANIPULATOR UTERINE 4.5 ZUMI (MISCELLANEOUS) ×3 IMPLANT
NEEDLE SPNL 22GX7 SPINOC (NEEDLE) ×2 IMPLANT
OCCLUDER COLPOPNEUMO (BALLOONS) ×3 IMPLANT
POUCH SPECIMEN RETRIEVAL 10MM (ENDOMECHANICALS) ×6 IMPLANT
SET TUBE IRRIG SUCTION NO TIP (IRRIGATION / IRRIGATOR) ×3 IMPLANT
SHEET LAVH (DRAPES) ×3 IMPLANT
SOLUTION ELECTROLUBE (MISCELLANEOUS) ×3 IMPLANT
SPONGE GAUZE 2X2 STER 10/PKG (GAUZE/BANDAGES/DRESSINGS) ×4
SPONGE LAP 18X18 X RAY DECT (DISPOSABLE) IMPLANT
STRIP CLOSURE SKIN 1/2X4 (GAUZE/BANDAGES/DRESSINGS) ×2 IMPLANT
SUT VIC AB 0 CT1 27 (SUTURE) ×9
SUT VIC AB 0 CT1 27XBRD ANTBC (SUTURE) ×3 IMPLANT
SUT VIC AB 4-0 PS2 27 (SUTURE) ×6 IMPLANT
SUT VICRYL 0 UR6 27IN ABS (SUTURE) ×3 IMPLANT
SYR BULB IRRIGATION 50ML (SYRINGE) IMPLANT
SYRINGE 60CC LL (MISCELLANEOUS) ×2 IMPLANT
TOWEL OR 17X26 10 PK STRL BLUE (TOWEL DISPOSABLE) ×6 IMPLANT
TRAP SPECIMEN MUCOUS 40CC (MISCELLANEOUS) ×3 IMPLANT
TRAY FOLEY CATH 14FRSI W/METER (CATHETERS) ×3 IMPLANT
TRAY LAP CHOLE (CUSTOM PROCEDURE TRAY) ×3 IMPLANT
TROCAR XCEL 12X100 BLDLESS (ENDOMECHANICALS) ×3 IMPLANT
TROCAR XCEL BLUNT TIP 100MML (ENDOMECHANICALS) ×3 IMPLANT
TROCAR XCEL NON-BLD 5MMX100MML (ENDOMECHANICALS) ×3 IMPLANT
TUBING INSUFFLATION 10FT LAP (TUBING) ×3 IMPLANT
WATER STERILE IRR 1500ML POUR (IV SOLUTION) ×6 IMPLANT

## 2014-02-23 NOTE — Progress Notes (Signed)
Patients neighbor Erby Pian (also a nurse) is here for discharge teaching and she will go home with patient and also will spend night.  She has temporarily left to take prescription to [facility] and will bring back here. Husband and friend is here

## 2014-02-23 NOTE — Anesthesia Procedure Notes (Signed)
Procedure Name: Intubation Date/Time: 02/23/2014 7:55 AM Performed by: Ofilia Neas Pre-anesthesia Checklist: Patient identified, Emergency Drugs available, Suction available, Patient being monitored and Timeout performed Patient Re-evaluated:Patient Re-evaluated prior to inductionOxygen Delivery Method: Circle system utilized Preoxygenation: Pre-oxygenation with 100% oxygen Intubation Type: IV induction and Cricoid Pressure applied Ventilation: Mask ventilation without difficulty Laryngoscope Size: Mac and 4 Grade View: Grade III Tube type: Oral Tube size: 7.5 mm Number of attempts: 2 Airway Equipment and Method: Bougie stylet Placement Confirmation: positive ETCO2 and breath sounds checked- equal and bilateral Secured at: 21 cm Tube secured with: Tape Dental Injury: Teeth and Oropharynx as per pre-operative assessment  Difficulty Due To: Difficulty was unanticipated Future Recommendations: Recommend- induction with short-acting agent, and alternative techniques readily available Comments: Very anterior larynx rigid epiglottis.  First DL unable to vis cords with head lift/cricoid pressure, attempted bougee, difficult to pass under epiglottis,  Second DL MDA same findings, bougee passed ett over, no vis. Of cords.  Oral structions not damaged.

## 2014-02-23 NOTE — Anesthesia Postprocedure Evaluation (Signed)
  Anesthesia Post-op Note  Patient: Rhonda Bryant  Procedure(s) Performed: Procedure(s) (LRB): ROBOTIC ASSISTED BILATERAL SALPINGO OOPHORECTOMY (Bilateral)  Patient Location: PACU  Anesthesia Type: General  Level of Consciousness: awake and alert   Airway and Oxygen Therapy: Patient Spontanous Breathing  Post-op Pain: mild  Post-op Assessment: Post-op Vital signs reviewed, Patient's Cardiovascular Status Stable, Respiratory Function Stable, Patent Airway and No signs of Nausea or vomiting  Last Vitals:  Filed Vitals:   02/23/14 1045  BP: 135/53  Pulse: 69  Temp: 36.3 C  Resp: 22    Post-op Vital Signs: stable   Complications: No apparent anesthesia complications

## 2014-02-23 NOTE — Interval H&P Note (Signed)
History and Physical Interval Note:  02/23/2014 7:26 AM  Rhonda Bryant  has presented today for surgery, with the diagnosis of ovarian mass  The various methods of treatment have been discussed with the patient and family. After consideration of risks, benefits and other options for treatment, the patient has consented to  Procedure(s): ROBOTIC ASSISTED BILATERAL SALPINGO OOPHORECTOMY POSSIBLE STAGING (Bilateral) as a surgical intervention .  The patient's history has been reviewed, patient examined, no change in status, stable for surgery.  I have reviewed the patient's chart and labs.  Questions were answered to the patient's satisfaction.     [place], [facility]

## 2014-02-23 NOTE — Transfer of Care (Signed)
Immediate Anesthesia Transfer of Care Note  Patient: Rhonda Bryant  Procedure(s) Performed: Procedure(s): ROBOTIC ASSISTED BILATERAL SALPINGO OOPHORECTOMY (Bilateral)  Patient Location: PACU  Anesthesia Type:General  Level of Consciousness: awake, oriented, patient cooperative, lethargic and responds to stimulation  Airway & Oxygen Therapy: Patient Spontanous Breathing and Patient connected to face mask oxygen  Post-op Assessment: Report given to PACU RN, Post -op Vital signs reviewed and stable and Patient moving all extremities  Post vital signs: Reviewed and stable  Complications: No apparent anesthesia complications

## 2014-02-23 NOTE — Op Note (Signed)
Pre-operative Diagnosis: adnexal mass - left  Post-operative Diagnosis: benign appearing left adnexal mass  Operation: Robotic-assisted laparoscopic bilateral salpingoophorectomy   Surgeon: Janie Morning  Assistant Surgeon: Lahoma Crocker MD  Anesthesia: GET  Operative Findings: 1.  10 cm left adnexal mass. 2.  A 6 cm uterus. 3.  Right 2cm paratubal cyst l bilateral adnexa. 4.  No evidence of any intraperitoneal  Disease. No omental or diaphragmatic masses. 5.  Normal appearing cervix.    Procedure Details  The patient was seen in the Holding Room. The risks, benefits, complications, treatment options, and expected outcomes were discussed with the patient.  The patient concurred with the proposed plan, giving informed consent.  The site of surgery properly noted/marked. The patient was identified as Rhonda Bryant and the procedure verified as a Robotic-assisted bilateral salpingo oophorectomy with other indicated procedures. A Time Out was held and the above information confirmed.  Ms Rhonda Bryant t was placed in supine position after anesthesia and draped and prepped in the usual sterile manner. The abdominal drape was placed after the CholoraPrep had been allowed to dry for 3 minutes.  Her arms were tucked to her side with all appropriate precautions.  The chest was secured to the table.  The patient was placed in the semi-lithotomy position in [place].  The perineum was prepped with Betadine.  Foley catheter was placed.  A sterile speculum was placed in the vagina.  The cervix was grasped with a single-tooth tenaculum and dilated with Kennon Rounds dilators.  The ZUMI uterine manipulator with a small colpotomizer ring was placed without difficulty.  A pneum occluder balloon was placed over the manipulator.  A second time-out was performed.  OG tube placement was confirmed and to suction.   Procedure:  The patient was brought to the operating room where general anesthesia  was administered with no complications.  The patient was placed in the dorsal lithotomy position in padded Allen stirrups.  The arms were tucked at the sides with gel pads protecting the elbows and foam protecting the hands. The patient was then prepped.  A Foley was placed to gravity.  A small  size KOH ring was used to place around the cervix after the cervix had been dilated and then a RUMI manipulator was attached in the normal manner.  The patient was then draped in the normal manner.  Next, a 5 mm skin incision was made 1 cm below the subcostal margin in the midclavicular line.  The 5 mm Optiview port and scope was used for direct entry.  Opening pressure was under 10 mm CO2.  The sites of the other trocars were marked and infiltrated with marcaine with epinephrine.  The abdomen was insufflated and the findings were noted as above.   At this point and all points during the procedure, the patient's intra-abdominal pressure did not exceed 15 mmHg. Next, a 10 mm skin incision was made about 1 cm below the umbilicus and a right and left port was placed about 10 cm lateral to the umbilical port.  All ports were placed under direct visualization.   A spinal needle was inserted into the pelvis and the left adnexal mass decompressed in size.  The patient was placed in steep Trendelenburg.  Bowel was away into the upper abdomen.  The robot was docked in the normal manner.  Pelvic washings were collected.  The retroperitoneum was entered on the right parallel to the IP.   The ureter was identified on  the medial leaf of the broad ligament.  The peritoneum above the ureter was incised and stretched and the infundibulopelvic ligament was skeletonized, cauterized and cut.  The posterior peritoneum was taken down to the level of the cornua.  The utero-ovarian ligament and fallopian tube was transected.  The specimen was placed in an endopouch.      A similar procedure was performed on the left. Hemostasis was assured.   Robotic instruments were removed under direct visulaization.  The robot was undocked. and the specimens were removed from the LUQ port.  The left ovarian cyst was simple without any excrescences.   Valsalva maneuvers were used  To facilitate removal of gas from the abdomen.   The 10 mm ports were closed with Vicryl on a UR-5 needle and the fascia was closed with 0 Vicryl on a UR-5 needle.  The skin was closed with 4-0 Vicryl in a subcuticular manner.    Sponge, lap and needle counts correct x 2.  The patient was taken to the recovery room in stable condition.  The uterine manipulator and Koh ring were removed.    The vagina was swabbed with  minimal bleeding noted.   All instrument and needle counts were correct x  3.   The patient was transferred to the recovery room in stable condition.  Estimated Blood Loss:  minimal      Specimens: Pelvic washings, bilateral tubes and ovaries.         Complications:  None; patient tolerated the procedure well.         Disposition: PACU - hemodynamically stable.

## 2014-02-23 NOTE — H&P (View-Only) (Signed)
Consult Note: Gyn-Onc  Consult was requested by Dr. Toney Rakes for the evaluation of CRIS GIBBY 80 y.o. female with a deleterious BRCA 2 mutation and a 9cm left ovarian cyst  CC:  Chief Complaint  Patient presents with  . Ovarian Mass    New patient    Assessment/Plan:  Ms. NIKIESHA MILFORD  is a 80 y.o.  year old woman with a deleterious BRCA2 germline mutation, a history of bilateral breast cancer or, and a new finding of a 9 cm simple left ovarian cyst in the setting of a borderline CA 125 (35 units per milliliter).  I agree that could lead is strongly indicated to undergo BSO for her breast reduction BRCA mutation. This finding of a simple ovarian cyst and borderline CA 125 raises the possibility of an underlying malignancy. However, I discussed with the patient that simple, unilocular ovarian cysts less than 10 cm are rarely associated with malignancy.  I recommend laparoscopic bilateral salpingo-oophorectomy, with frozen section of the left tube and ovary, and staging (with hysterectomy lymphadenectomy and omentectomy) if cancer is found in the left ovary. We would obtain washings at the commencement of surgery and recommend complete sectioning of the tube and ovary given her a known deleterious BRCA2 germline mutation.  I discussed with the patient surgical risks including  bleeding, infection, damage to internal organs (such as bladder,ureters, bowels), blood clot, reoperation and rehospitalization.  I discussed the risk for conversion to laparotomy, and increase hospitalization recovery should staging be necessary. Otherwise anticipate an outpatient procedure the patient. We discussed plans recovery and recuperation. All questions were answered.    HPI: Yanci Bachtell is an 80 year old gravida 3 para 3 with a known deleterious BRCA2 germline mutation and a history of bilateral breast cancer. The patient is NED from her breast cancer. She was undergoing workup by Dr.  Toney Rakes for a planned risk reducing BSO. That workup included a transvaginal ultrasound which revealed a normal postmenopausal appearing uterus and cervix with a 4 mm endometrial stripe, a normal-appearing right ovary, however the left ovary contained a 9 x 7 x 9 cm simple, unilocular, echo free, thin walled avascular cystic mass. To further workup the possibility for underlying malignancy tumor markers were drawn on 01/29/2014. This showed a CA 125 of 35. HE for a ROMA scores were negative.  Interval History: She reports mild abdominal fullness and bloating. She denies vaginal bleeding, pelvic pain, early satiety, or weight loss.  Current Meds:  Outpatient Encounter Prescriptions as of 02/04/2014  Medication Sig  . acetaminophen (TYLENOL) 500 MG tablet Take 500 mg by mouth every 6 (six) hours as needed for pain.   Marland Kitchen ADVAIR DISKUS 100-50 MCG/DOSE AEPB INHALE ONE PUFF INTO THE LUNGS TWICE DAILY -RINSE MOUTH  . Aloe-Sodium Chloride (AYR SALINE NASAL GEL NA) Place 1 application into the nose as needed (nasal driness.or congestion).  . ALPRAZolam (XANAX) 0.25 MG tablet Take 0.25 mg by mouth 2 (two) times daily as needed for sleep or anxiety.   Marland Kitchen amLODipine (NORVASC) 10 MG tablet Take 5 mg by mouth daily.  Marland Kitchen anastrozole (ARIMIDEX) 1 MG tablet Take 1 tablet (1 mg total) by mouth daily.  . calcium carbonate (TUMS EX) 750 MG chewable tablet Chew 1 tablet by mouth 2 (two) times daily as needed for heartburn.  . Calcium-Vitamin D 600-125 MG-UNIT TABS Take 1 tablet by mouth 2 (two) times daily.    Marland Kitchen denosumab (PROLIA) 60 MG/ML SOLN injection Inject 60 mg into the skin every 6 (  six) months. Administer in upper arm, thigh, or abdomen  . loratadine (CLARITIN) 10 MG tablet Take 10 mg by mouth daily.  . Multiple Vitamin (MULTIVITAMIN) capsule Take 1 capsule by mouth daily.    Marland Kitchen MYRBETRIQ 25 MG TB24 Take 25 mg by mouth daily.   Marland Kitchen NASONEX 50 MCG/ACT nasal spray TWO SPRAYS IN EACH NOSTRIL ONCE DAILY  . albuterol  (PROVENTIL HFA;VENTOLIN HFA) 108 (90 BASE) MCG/ACT inhaler Inhale 2 puffs into the lungs every 6 (six) hours as needed for wheezing or shortness of breath.    Allergy:  Allergies  Allergen Reactions  . Codeine     REACTION: nausea  . Nsaids     Pt states to avoid this med causes nausea.  . Other     Ragweed and Dust  . Sulfamethoxazole-Trimethoprim     REACTION: nausea    Social Hx:   History   Social History  . Marital Status: Married    Spouse Name: N/A    Number of Children: N/A  . Years of Education: N/A   Occupational History  . Not on file.   Social History Main Topics  . Smoking status: Former Smoker    Quit date: 01/21/1978  . Smokeless tobacco: Never Used  . Alcohol Use: Yes     Comment: rarely  . Drug Use: No  . Sexual Activity: Not on file   Other Topics Concern  . Not on file   Social History Narrative  . No narrative on file    Past Surgical Hx:  Past Surgical History  Procedure Laterality Date  . Mastectomy      left  . Cataract extraction    . Tonsillectomy    . Tonsillectomy    . Colonoscopy    . Simple mastectomy with axillary sentinel node biopsy Right 04/03/2013    Procedure: TOTAL MASTECTOMY WITH AXILLARY SENTINEL NODE BIOPSY;  Surgeon: Edward Jolly, MD;  Location: [facility];  Service: General;  Laterality: Right;  . Capsulectomy Right 04/03/2013    Procedure: TOTAL CAPSULECTOMY/REMOVAL OF IMPLANT MATERIAL RIGHT BREAST;  Surgeon: Crissie Reese, MD;  Location: [facility];  Service: Plastics;  Laterality: Right;    Past Medical Hx:  Past Medical History  Diagnosis Date  . Chronic airway obstruction, not elsewhere classified   . Allergic rhinitis, cause unspecified   . Unspecified essential hypertension   . Osteoporosis   . Hypertension   . Arthritis   . Blood transfusion without reported diagnosis   . Cancer   . Heart murmur   . PONV (postoperative nausea and vomiting)   . Depression   . Anxiety   . Shortness of breath     only  with lots of activity  . Pneumonia     had twice  . Malignant neoplasm of breast (female), unspecified site     bilateral breast cancer ages 56 and 45  . Family history of malignant neoplasm of breast   . BRCA2 positive     p.R2520* (c.7558C>T) also known as 7786C>T  . BRCA2 positive   . COPD (chronic obstructive pulmonary disease)     Past Gynecological History:  G3 (3 svd)  No LMP recorded. Patient is postmenopausal.  Family Hx:  Family History  Problem Relation Age of Onset  . Pancreatic cancer Father     deceased 26  . Breast cancer Mother 67    deceased 24    Review of Systems:  Constitutional  Feels well,    ENT Normal appearing  ears and nares bilaterally Skin/Breast  No rash, sores, jaundice, itching, dryness Cardiovascular  No chest pain, shortness of breath, or edema  Pulmonary  No cough or wheeze.  Gastro Intestinal  No nausea, vomitting, or diarrhoea. No bright red blood per rectum, no abdominal pain, change in bowel movement, or constipation.  Genito Urinary  No frequency, urgency, dysuria, see HPI Musculo Skeletal  No myalgia, arthralgia, joint swelling or pain  Neurologic  No weakness, numbness, change in gait,  Psychology  No depression, anxiety, insomnia.   Vitals:  Blood pressure 152/54, pulse 86, temperature 98.1 F (36.7 C), temperature source Oral, resp. rate 18, height $RemoveBe'5\' 4"'ffruWGseT$  (1.626 m), weight 113 lb 4.8 oz (51.393 kg).  Physical Exam: WD in NAD Neck  Supple NROM, without any enlargements.  Lymph Node Survey No cervical supraclavicular or inguinal adenopathy Cardiovascular  Pulse normal rate, regularity and rhythm. S1 and S2 normal.  Lungs  Clear to auscultation bilateraly, without wheezes/crackles/rhonchi. Good air movement.  Skin  No rash/lesions/breakdown  Psychiatry  Alert and oriented to person, place, and time  Abdomen  Normoactive bowel sounds, abdomen soft, non-tender and thin without evidence of hernia.  Back No CVA  tenderness Genito Urinary  Vulva/vagina: Normal external female genitalia.  No lesions. No discharge or bleeding.  Bladder/urethra:  No lesions or masses, well supported bladder  Vagina: normal, narrow, postmenopausal  Cervix: Normal appearing, no lesions.  Uterus:  Small, mobile, no parametrial involvement or nodularity.  Adnexa: Palpable smooth, mobile,cystic mass in cul de sac.  Rectal  Good tone, no masses no cul de sac nodularity.  Extremities  No bilateral cyanosis, clubbing or edema.   Donaciano Eva, MD   02/04/2014, 4:32 PM

## 2014-02-24 ENCOUNTER — Encounter (HOSPITAL_COMMUNITY): Payer: Self-pay | Admitting: Gynecologic Oncology

## 2014-02-25 ENCOUNTER — Telehealth: Payer: Self-pay | Admitting: Gynecologic Oncology

## 2014-02-25 NOTE — Telephone Encounter (Signed)
Post op telephone call to check patient status.  Patient describes expected post operative status.  Adequate PO intake reported.  Bowels and bladder functioning without difficulty.  Pain minimal.  Reportable signs and symptoms reviewed.  Follow up appt given.  Final pathology discussed.

## 2014-03-04 ENCOUNTER — Ambulatory Visit: Payer: Medicare Other | Attending: Gynecologic Oncology | Admitting: Gynecologic Oncology

## 2014-03-04 ENCOUNTER — Encounter: Payer: Self-pay | Admitting: Gynecologic Oncology

## 2014-03-04 VITALS — BP 183/62 | HR 76 | Temp 98.0°F | Resp 20 | Ht 64.0 in | Wt 110.2 lb

## 2014-03-04 DIAGNOSIS — Z09 Encounter for follow-up examination after completed treatment for conditions other than malignant neoplasm: Secondary | ICD-10-CM | POA: Insufficient documentation

## 2014-03-04 DIAGNOSIS — N939 Abnormal uterine and vaginal bleeding, unspecified: Secondary | ICD-10-CM | POA: Insufficient documentation

## 2014-03-04 DIAGNOSIS — Z1501 Genetic susceptibility to malignant neoplasm of breast: Secondary | ICD-10-CM | POA: Diagnosis not present

## 2014-03-04 DIAGNOSIS — N839 Noninflammatory disorder of ovary, fallopian tube and broad ligament, unspecified: Secondary | ICD-10-CM | POA: Diagnosis present

## 2014-03-04 DIAGNOSIS — N952 Postmenopausal atrophic vaginitis: Secondary | ICD-10-CM | POA: Diagnosis not present

## 2014-03-04 DIAGNOSIS — Z853 Personal history of malignant neoplasm of breast: Secondary | ICD-10-CM | POA: Insufficient documentation

## 2014-03-04 DIAGNOSIS — Z9079 Acquired absence of other genital organ(s): Secondary | ICD-10-CM | POA: Insufficient documentation

## 2014-03-04 DIAGNOSIS — N838 Other noninflammatory disorders of ovary, fallopian tube and broad ligament: Secondary | ICD-10-CM

## 2014-03-04 DIAGNOSIS — N898 Other specified noninflammatory disorders of vagina: Secondary | ICD-10-CM

## 2014-03-04 NOTE — Progress Notes (Signed)
Consult Note: Gyn-Onc  Consult was requested by Dr. Toney Rakes for the evaluation of Rhonda Bryant 80 y.o. female with a deleterious BRCA 2 mutation and a 9cm left ovarian cyst  CC:  Chief Complaint  Patient presents with  . Ovarian Mass    Follow up post-op    Assessment/Plan:  Rhonda Bryant  is a 80 y.o.  year old woman with a deleterious BRCA2 germline mutation, a history of bilateral breast cancer and s/p RA BSO 02/23/2014 for a 9cm serous cystadenoma.  No with vaginal bleeding.  Her vagina is markedly atropic with significant bruising and bleeding from multiple sites.  Monsels applied and hemostasis was achieved.    F/U in 2 weeks as scheduled.  If bleeding continues Rhonda Bryant  is advised to contact us.    HPI: Rhonda Bryant is an 80 year old gravida 3 para 3 with a known deleterious BRCA2 germline mutation and a history of bilateral breast cancer. The patient is NED from her breast cancer. She was undergoing workup by Dr. Toney Rakes for a planned risk reducing BSO. That workup included a transvaginal ultrasound which revealed a normal postmenopausal appearing uterus and cervix with a 4 mm endometrial stripe, a normal-appearing right ovary, however the left ovary contained a 9 x 7 x 9 cm simple, unilocular, echo free, thin walled avascular cystic mass. To further workup the possibility for underlying malignancy tumor markers were drawn on 01/29/2014. This showed a CA 125 of 35. HE for a ROMA scores were negative.  Interval History: She underwent BSO last week Tuesday 02/23/2014.  Reports red vaginal bleeding since.  Denies the use of any anticoagulants.  Path 1. Ovary and fallopian tube, left - BENIGN SEROUS CYST, NO ATYPIA OR MALIGNANCY. - BENIGN FALLOPIAN TUBAL TISSUE, NO ATYPIA OR MALIGNANCY. 2. Ovary and fallopian tube, right - BENIGN OVARIAN TISSUE WITH ENDOSALPINGIOSIS, NO ATYPIA, OR MALIGNANCY. - BENIGN FALLOPIAN TUBAL TISSUE, NO ATYPIA OR  MALIGNANCY.  Current Meds:  Outpatient Encounter Prescriptions as of 03/04/2014  Medication Sig  . amLODipine (NORVASC) 10 MG tablet Take 5 mg by mouth every Bryant.   Marland Kitchen anastrozole (ARIMIDEX) 1 MG tablet Take 1 mg by mouth daily.  . calcium carbonate (TUMS EX) 750 MG chewable tablet Chew 1 tablet by mouth 2 (two) times daily as needed for heartburn.  . Calcium-Vitamin D 600-125 MG-UNIT TABS Take 1 tablet by mouth 2 (two) times daily.    Marland Kitchen denosumab (PROLIA) 60 MG/ML SOLN injection Inject 60 mg into the skin every 6 (six) months. Administer in upper arm, thigh, or abdomen  . Fluticasone-Salmeterol (ADVAIR) 100-50 MCG/DOSE AEPB Inhale 1 puff into the lungs at bedtime as needed.  . loratadine (CLARITIN) 10 MG tablet Take 10 mg by mouth every Bryant.   . miconazole (MICOTIN) 2 % cream Apply 1 application topically 2 (two) times daily as needed (vulva itching.).  Marland Kitchen mometasone (NASONEX) 50 MCG/ACT nasal spray Place 2 sprays into the nose at bedtime.  . Multiple Vitamin (MULTIVITAMIN) capsule Take 1 capsule by mouth every Bryant.   Marland Kitchen MYRBETRIQ 25 MG TB24 Take 25 mg by mouth every Bryant.   Marland Kitchen omeprazole (PRILOSEC OTC) 20 MG tablet Take 20 mg by mouth every Bryant.  Marland Kitchen albuterol (PROVENTIL HFA;VENTOLIN HFA) 108 (90 BASE) MCG/ACT inhaler Inhale 2 puffs into the lungs every 6 (six) hours as needed for wheezing or shortness of breath.  . Aloe-Sodium Chloride (AYR SALINE NASAL GEL NA) Place 1 application into the nose as needed (nasal driness.or  congestion).  . ALPRAZolam (XANAX) 0.25 MG tablet Take 0.25 mg by mouth 2 (two) times daily as needed for sleep or anxiety.   Marland Kitchen oxyCODONE-acetaminophen (ROXICET) 5-325 MG per tablet Take 1-2 tablets by mouth every 6 (six) hours as needed for moderate pain.    Allergy:  Allergies  Allergen Reactions  . Codeine Nausea Only    REACTION: nausea  . Nsaids     Pt states to avoid this med causes nausea.  . Other     Ragweed and Dust  .  Sulfamethoxazole-Trimethoprim Nausea Only    REACTION: nausea    Social Hx:   History   Social History  . Marital Status: Married    Spouse Name: N/A    Number of Children: N/A  . Years of Education: N/A   Occupational History  . Not on file.   Social History Main Topics  . Smoking status: Former Smoker -- 4 years    Types: Cigarettes    Quit date: 06/11/1957  . Smokeless tobacco: Never Used  . Alcohol Use: Yes     Comment: rarely  . Drug Use: No  . Sexual Activity: Not on file   Other Topics Concern  . Not on file   Social History Narrative  . No narrative on file    Past Surgical Hx:  Past Surgical History  Procedure Laterality Date  . Cataract extraction Bilateral   . Tonsillectomy    . Colonoscopy    . Simple mastectomy with axillary sentinel node biopsy Right 04/03/2013    Procedure: TOTAL MASTECTOMY WITH AXILLARY SENTINEL NODE BIOPSY;  Surgeon: Edward Jolly, MD;  Location: [facility];  Service: General;  Laterality: Right;  . Capsulectomy Right 04/03/2013    Procedure: TOTAL CAPSULECTOMY/REMOVAL OF IMPLANT MATERIAL RIGHT BREAST;  Surgeon: Crissie Reese, MD;  Location: [facility];  Service: Plastics;  Laterality: Right;  . Mastectomy  1989    left-with lymph nodes  . Tonsillectomy  as child  . Skin cancer destruction      x2 on nose, x1 on neck  . Robotic assisted bilateral salpingo oopherectomy Bilateral 02/23/2014    Procedure: ROBOTIC ASSISTED BILATERAL SALPINGO OOPHORECTOMY;  Surgeon: Rhonda Morning, MD;  Location: [facility];  Service: Gynecology;  Laterality: Bilateral;    Past Medical Hx:  Past Medical History  Diagnosis Date  . Chronic airway obstruction, not elsewhere classified   . Allergic rhinitis, cause unspecified   . Osteoporosis   . Blood transfusion without reported diagnosis     3 units with 3rd child  . Cancer   . PONV (postoperative nausea and vomiting)   . Depression   . Anxiety   . Shortness of breath     only with lots of activity   . Malignant neoplasm of breast (female), unspecified site     bilateral breast cancer ages 80 and 89  . Family history of malignant neoplasm of breast   . BRCA2 positive     p.R2520* (c.7558C>T) also known as 7786C>T  . COPD (chronic obstructive pulmonary disease)   . Asthma   . Pneumonia     had twice  . Heart murmur     slight  . Unspecified essential hypertension   . Swelling of ankle     bilateral  . Headache(784.0)   . Arthritis     neck  . Double vision     right eye    Past Gynecological History:  G3 (3 svd)  No LMP recorded. Patient is postmenopausal.  Family Hx:  Family History  Problem Relation Age of Onset  . Pancreatic cancer Father     deceased 72  . Breast cancer Mother 62    deceased 64    Review of Systems:  Constitutional  Feels well,    Genito Urinary  No frequency, urgency, dysuria, reports vaginal bleeding since surgery Musculo Skeletal  No myalgia, arthralgia, joint swelling or pain  Neurologic  No weakness, numbness, change in gait,   Vitals:  Blood pressure 183/62, pulse 76, temperature 98 F (36.7 C), temperature source Oral, resp. rate 20, height $RemoveBe'5\' 4"'GBEjITbrR$  (1.626 m), weight 110 lb 3.2 oz (49.986 kg).  Physical Exam: WD in NAD Alert and oriented to person, place, and time  Abdomen  Normoactive bowel sounds, abdomen soft, non-tender and thin without evidence of hernia.  Port sites intact. Back No CVA tenderness Genito Urinary   Vagina: normal, narrow, postmenopausal atrophic with friability of the vaginal wall and the ectocervix.  Monsels was applied and hemostasis achieved.   Extremities  No bilateral cyanosis, clubbing or edema.   Rhonda Morning, MD   03/04/2014, 6:59 PM

## 2014-03-04 NOTE — Patient Instructions (Signed)
For issues or concerns after hours, call [phone] and ask to speak with the GYN Oncologist on call.  Contact the office for any further bleeding.

## 2014-03-12 ENCOUNTER — Ambulatory Visit: Payer: Medicare Other | Attending: Gynecologic Oncology | Admitting: Gynecologic Oncology

## 2014-03-12 ENCOUNTER — Encounter: Payer: Self-pay | Admitting: Gynecologic Oncology

## 2014-03-12 VITALS — BP 154/62 | HR 85 | Temp 98.0°F | Resp 16 | Ht 64.0 in | Wt 111.4 lb

## 2014-03-12 DIAGNOSIS — Z1501 Genetic susceptibility to malignant neoplasm of breast: Secondary | ICD-10-CM | POA: Insufficient documentation

## 2014-03-12 DIAGNOSIS — J449 Chronic obstructive pulmonary disease, unspecified: Secondary | ICD-10-CM | POA: Insufficient documentation

## 2014-03-12 DIAGNOSIS — I10 Essential (primary) hypertension: Secondary | ICD-10-CM | POA: Diagnosis not present

## 2014-03-12 DIAGNOSIS — Z79899 Other long term (current) drug therapy: Secondary | ICD-10-CM | POA: Insufficient documentation

## 2014-03-12 DIAGNOSIS — Z803 Family history of malignant neoplasm of breast: Secondary | ICD-10-CM | POA: Diagnosis not present

## 2014-03-12 DIAGNOSIS — Z87891 Personal history of nicotine dependence: Secondary | ICD-10-CM | POA: Insufficient documentation

## 2014-03-12 DIAGNOSIS — Z9013 Acquired absence of bilateral breasts and nipples: Secondary | ICD-10-CM | POA: Insufficient documentation

## 2014-03-12 DIAGNOSIS — Z800 Family history of malignant neoplasm of digestive organs: Secondary | ICD-10-CM | POA: Diagnosis not present

## 2014-03-12 DIAGNOSIS — N952 Postmenopausal atrophic vaginitis: Secondary | ICD-10-CM | POA: Diagnosis not present

## 2014-03-12 DIAGNOSIS — N99820 Postprocedural hemorrhage and hematoma of a genitourinary system organ or structure following a genitourinary system procedure: Secondary | ICD-10-CM | POA: Diagnosis not present

## 2014-03-12 DIAGNOSIS — N8320 Unspecified ovarian cysts: Secondary | ICD-10-CM

## 2014-03-12 DIAGNOSIS — Z853 Personal history of malignant neoplasm of breast: Secondary | ICD-10-CM | POA: Diagnosis not present

## 2014-03-12 DIAGNOSIS — N939 Abnormal uterine and vaginal bleeding, unspecified: Secondary | ICD-10-CM

## 2014-03-12 DIAGNOSIS — N83202 Unspecified ovarian cyst, left side: Secondary | ICD-10-CM

## 2014-03-12 NOTE — Patient Instructions (Signed)
You may resume your normal activities as tolerated.  Please call for any questions or concerns and follow up with GYN Oncology as needed.

## 2014-03-12 NOTE — Progress Notes (Signed)
Postoperative Note: Gyn-Onc  CC: Dr Toney Rakes Chief Complaint  Patient presents with  . Vaginal Bleeding    postop    Assessment/Plan:  Ms. Rhonda Bryant  is a 80 y.o.  year old woman with a deleterious BRCA2 germline mutation, a history of bilateral breast cancer who is s/p RA BSO 02/23/2014 for a 9cm serous cystadenoma.  She presents for evaluation today due to vaginal bleeding.  Her vagina is markedly atropic There is no site of major bleeding or pathology that requires treatment topically. It is likely a combination of atrophy from hypoestrogenism and chemical irritation from monsels that is causing her peristent spotting. The level of bleeding is not concerning.  I offered her a trial of premarin vaginal cream for 2-3 weeks 3 times per week to strengthen vaginal tissues and aid in resolution of this issue. I discussed that with her ER receptor positive breast cancer I have low concerns that a short course of topical estrogen is dangerous as systemic absorption is minimal. She has elected for monitoring over the coming days and will notify us if she would prefer to intervene with estrogen vaginally.  I have cleared her from all activities as she is otherwise well healed from surgery.  HPI: Rhonda Bryant is an 80 year old gravida 3 para 3 with a known deleterious BRCA2 germline mutation and a history of bilateral breast cancer. The patient is NED from her breast cancer. She was undergoing workup by Dr. Toney Rakes for a planned risk reducing BSO. That workup included a transvaginal ultrasound which revealed a normal postmenopausal appearing uterus and cervix with a 4 mm endometrial stripe, a normal-appearing right ovary, however the left ovary contained a 9 x 7 x 9 cm simple, unilocular, echo free, thin walled avascular cystic mass. To further workup the possibility for underlying malignancy tumor markers were drawn on 01/29/2014. This showed a CA 125 of 35. HE for a ROMA scores were  negative.  Interval History: She underwent BSO 02/23/2014.  She had vaginal bleeding from uterine manipulator/tenaculum sites at the cervix postoperatively which required application of monsels solution. Denies the use of any anticoagulants.  Path 1. Ovary and fallopian tube, left - BENIGN SEROUS CYST, NO ATYPIA OR MALIGNANCY. - BENIGN FALLOPIAN TUBAL TISSUE, NO ATYPIA OR MALIGNANCY. 2. Ovary and fallopian tube, right - BENIGN OVARIAN TISSUE WITH ENDOSALPINGIOSIS, NO ATYPIA, OR MALIGNANCY. - BENIGN FALLOPIAN TUBAL TISSUE, NO ATYPIA OR MALIGNANCY.  Current Meds:  Outpatient Encounter Prescriptions as of 03/12/2014  Medication Sig  . albuterol (PROVENTIL HFA;VENTOLIN HFA) 108 (90 BASE) MCG/ACT inhaler Inhale 2 puffs into the lungs every 6 (six) hours as needed for wheezing or shortness of breath.  . Aloe-Sodium Chloride (AYR SALINE NASAL GEL NA) Place 1 application into the nose as needed (nasal driness.or congestion).  . ALPRAZolam (XANAX) 0.25 MG tablet Take 0.25 mg by mouth 2 (two) times daily as needed for sleep or anxiety.   Marland Kitchen amLODipine (NORVASC) 10 MG tablet Take 5 mg by mouth every morning.   Marland Kitchen anastrozole (ARIMIDEX) 1 MG tablet Take 1 mg by mouth daily.  . calcium carbonate (TUMS EX) 750 MG chewable tablet Chew 1 tablet by mouth 2 (two) times daily as needed for heartburn.  . Calcium-Vitamin D 600-125 MG-UNIT TABS Take 1 tablet by mouth 2 (two) times daily.    Marland Kitchen denosumab (PROLIA) 60 MG/ML SOLN injection Inject 60 mg into the skin every 6 (six) months. Administer in upper arm, thigh, or abdomen  . Fluticasone-Salmeterol (ADVAIR) 100-50 MCG/DOSE  AEPB Inhale 1 puff into the lungs at bedtime as needed.  . loratadine (CLARITIN) 10 MG tablet Take 10 mg by mouth every morning.   . miconazole (MICOTIN) 2 % cream Apply 1 application topically 2 (two) times daily as needed (vulva itching.).  Marland Kitchen mometasone (NASONEX) 50 MCG/ACT nasal spray Place 2 sprays into the nose at bedtime.  . Multiple  Vitamin (MULTIVITAMIN) capsule Take 1 capsule by mouth every morning.   Marland Kitchen MYRBETRIQ 25 MG TB24 Take 25 mg by mouth every morning.   Marland Kitchen omeprazole (PRILOSEC OTC) 20 MG tablet Take 20 mg by mouth every morning.  Marland Kitchen oxyCODONE-acetaminophen (ROXICET) 5-325 MG per tablet Take 1-2 tablets by mouth every 6 (six) hours as needed for moderate pain.    Allergy:  Allergies  Allergen Reactions  . Codeine Nausea Only    REACTION: nausea  . Nsaids     Pt states to avoid this med causes nausea.  . Other     Ragweed and Dust  . Sulfamethoxazole-Trimethoprim Nausea Only    REACTION: nausea    Social Hx:   History   Social History  . Marital Status: Married    Spouse Name: N/A    Number of Children: N/A  . Years of Education: N/A   Occupational History  . Not on file.   Social History Main Topics  . Smoking status: Former Smoker -- 4 years    Types: Cigarettes    Quit date: 06/11/1957  . Smokeless tobacco: Never Used  . Alcohol Use: Yes     Comment: rarely  . Drug Use: No  . Sexual Activity: Not on file   Other Topics Concern  . Not on file   Social History Narrative  . No narrative on file    Past Surgical Hx:  Past Surgical History  Procedure Laterality Date  . Cataract extraction Bilateral   . Tonsillectomy    . Colonoscopy    . Simple mastectomy with axillary sentinel node biopsy Right 04/03/2013    Procedure: TOTAL MASTECTOMY WITH AXILLARY SENTINEL NODE BIOPSY;  Surgeon: Edward Jolly, MD;  Location: [facility];  Service: General;  Laterality: Right;  . Capsulectomy Right 04/03/2013    Procedure: TOTAL CAPSULECTOMY/REMOVAL OF IMPLANT MATERIAL RIGHT BREAST;  Surgeon: Crissie Reese, MD;  Location: [facility];  Service: Plastics;  Laterality: Right;  . Mastectomy  1989    left-with lymph nodes  . Tonsillectomy  as child  . Skin cancer destruction      x2 on nose, x1 on neck  . Robotic assisted bilateral salpingo oopherectomy Bilateral 02/23/2014    Procedure: ROBOTIC ASSISTED  BILATERAL SALPINGO OOPHORECTOMY;  Surgeon: Janie Morning, MD;  Location: [facility];  Service: Gynecology;  Laterality: Bilateral;    Past Medical Hx:  Past Medical History  Diagnosis Date  . Chronic airway obstruction, not elsewhere classified   . Allergic rhinitis, cause unspecified   . Osteoporosis   . Blood transfusion without reported diagnosis     3 units with 3rd child  . Cancer   . PONV (postoperative nausea and vomiting)   . Depression   . Anxiety   . Shortness of breath     only with lots of activity  . Malignant neoplasm of breast (female), unspecified site     bilateral breast cancer ages 36 and 17  . Family history of malignant neoplasm of breast   . BRCA2 positive     [id number]* (c.7558C>T) also known as 7786C>T  . COPD (chronic obstructive  pulmonary disease)   . Asthma   . Pneumonia     had twice  . Heart murmur     slight  . Unspecified essential hypertension   . Swelling of ankle     bilateral  . Headache(784.0)   . Arthritis     neck  . Double vision     right eye    Past Gynecological History:  G3 (3 svd)  No LMP recorded. Patient is postmenopausal.  Family Hx:  Family History  Problem Relation Age of Onset  . Pancreatic cancer Father     deceased 110  . Breast cancer Mother 90    deceased 107    Review of Systems:  Constitutional  Feels well,    Genito Urinary  No frequency, urgency, dysuria, reports vaginal bleeding since surgery Musculo Skeletal  No myalgia, arthralgia, joint swelling or pain  Neurologic  No weakness, numbness, change in gait,   Vitals:  Blood pressure 154/62, pulse 85, temperature 98 F (36.7 C), temperature source Oral, resp. rate 16, height $RemoveBe'5\' 4"'JNqyrZNWe$  (1.626 m), weight 111 lb 6.4 oz (50.531 kg).  Physical Exam: WD in NAD Alert and oriented to person, place, and time  Abdomen  Normoactive bowel sounds, abdomen soft, non-tender and thin without evidence of hernia.  Port sites intact and well healed. Back No CVA  tenderness Genito Urinary   Vagina: normal, narrow, postmenopausal atrophic with excoriations from monsels solution,  friability of the vaginal wall and the ectocervix.  No blood in vaginal or bleeding upon initial placement of speculum, however, gentle touching with the rectal swab caused mild spotting from the fragile tissues..   Extremities  No bilateral cyanosis, clubbing or edema.   Donaciano Eva, MD   03/12/2014, 2:30 PM

## 2014-03-15 ENCOUNTER — Ambulatory Visit: Payer: Medicare Other | Admitting: Gynecologic Oncology

## 2014-03-25 ENCOUNTER — Encounter: Payer: Self-pay | Admitting: *Deleted

## 2014-04-01 ENCOUNTER — Telehealth: Payer: Self-pay | Admitting: Internal Medicine

## 2014-04-01 MED ORDER — AZITHROMYCIN 250 MG PO TABS: ORAL_TABLET | ORAL | Status: DC

## 2014-04-01 NOTE — Telephone Encounter (Signed)
Called and spoke to pt. Informed pt of the recs per CY. Rx sent to preferred pharmacy. Pt verbalized understanding and denied any further questions or concerns at this time.  

## 2014-04-01 NOTE — Telephone Encounter (Signed)
Ok Zpak

## 2014-04-01 NOTE — Telephone Encounter (Signed)
Pt is upset that she hasn't been called yet.  States she has a very bad cough.  Please call asap.  Rhonda Bryant

## 2014-04-01 NOTE — Telephone Encounter (Signed)
Called and spoke to pt. Pt c/o increasing non productive cough and rhinorrhea x 1-2 days. Pt requesting zpak so the cold doesn't progress and worsen. Pt denies f/c/s, SOB, CP/tightness, chest or sinus congestion. Pt last seen in 08/2013 with yearly f/u.  CY please advise.  Allergies  Allergen Reactions  . Codeine Nausea Only    REACTION: nausea  . Nsaids     Pt states to avoid this med causes nausea.  . Other     Ragweed and Dust  . Sulfamethoxazole-Trimethoprim Nausea Only    REACTION: nausea    Current Outpatient Prescriptions on File Prior to Visit  Medication Sig Dispense Refill  . albuterol (PROVENTIL HFA;VENTOLIN HFA) 108 (90 BASE) MCG/ACT inhaler Inhale 2 puffs into the lungs every 6 (six) hours as needed for wheezing or shortness of breath.      . Aloe-Sodium Chloride (AYR SALINE NASAL GEL NA) Place 1 application into the nose as needed (nasal driness.or congestion).      . ALPRAZolam (XANAX) 0.25 MG tablet Take 0.25 mg by mouth 2 (two) times daily as needed for sleep or anxiety.       Marland Kitchen amLODipine (NORVASC) 10 MG tablet Take 5 mg by mouth every morning.       Marland Kitchen anastrozole (ARIMIDEX) 1 MG tablet Take 1 mg by mouth daily.      . calcium carbonate (TUMS EX) 750 MG chewable tablet Chew 1 tablet by mouth 2 (two) times daily as needed for heartburn.      . Calcium-Vitamin D 600-125 MG-UNIT TABS Take 1 tablet by mouth 2 (two) times daily.        Marland Kitchen denosumab (PROLIA) 60 MG/ML SOLN injection Inject 60 mg into the skin every 6 (six) months. Administer in upper arm, thigh, or abdomen      . Fluticasone-Salmeterol (ADVAIR) 100-50 MCG/DOSE AEPB Inhale 1 puff into the lungs at bedtime as needed.      . loratadine (CLARITIN) 10 MG tablet Take 10 mg by mouth every morning.       . miconazole (MICOTIN) 2 % cream Apply 1 application topically 2 (two) times daily as needed (vulva itching.).      Marland Kitchen mometasone (NASONEX) 50 MCG/ACT nasal spray Place 2 sprays into the nose at bedtime.      . Multiple  Vitamin (MULTIVITAMIN) capsule Take 1 capsule by mouth every morning.       Marland Kitchen MYRBETRIQ 25 MG TB24 Take 25 mg by mouth every morning.       Marland Kitchen omeprazole (PRILOSEC OTC) 20 MG tablet Take 20 mg by mouth every morning.      Marland Kitchen oxyCODONE-acetaminophen (ROXICET) 5-325 MG per tablet Take 1-2 tablets by mouth every 6 (six) hours as needed for moderate pain.  30 tablet  0   No current facility-administered medications on file prior to visit.

## 2014-04-09 ENCOUNTER — Telehealth: Payer: Self-pay | Admitting: Internal Medicine

## 2014-04-09 NOTE — Telephone Encounter (Signed)
Pt aware of recs.  Nothing further needed. 

## 2014-04-09 NOTE — Telephone Encounter (Signed)
Called pt. Rhonda Bryant called in [place] on 10/22 and finished on Monday. Reports cough is less and is now getting up clear phlem. She also reports she has no apetite, jittery, anxious, off balanced, having nausea lasting all day. Please advise Rhonda Bryant thanks  Allergies  Allergen Reactions  . Codeine Nausea Only    REACTION: nausea  . Nsaids     Pt states to avoid this med causes nausea.  . Other     Ragweed and Dust  . Sulfamethoxazole-Trimethoprim Nausea Only    REACTION: nausea     Current Outpatient Prescriptions on File Prior to Visit  Medication Sig Dispense Refill  . albuterol (PROVENTIL HFA;VENTOLIN HFA) 108 (90 BASE) MCG/ACT inhaler Inhale 2 puffs into the lungs every 6 (six) hours as needed for wheezing or shortness of breath.      . Aloe-Sodium Chloride (AYR SALINE NASAL GEL NA) Place 1 application into the nose as needed (nasal driness.or congestion).      . ALPRAZolam (XANAX) 0.25 MG tablet Take 0.25 mg by mouth 2 (two) times daily as needed for sleep or anxiety.       Marland Kitchen amLODipine (NORVASC) 10 MG tablet Take 5 mg by mouth every morning.       Marland Kitchen anastrozole (ARIMIDEX) 1 MG tablet Take 1 mg by mouth daily.      Marland Kitchen azithromycin (ZITHROMAX Z-PAK) 250 MG tablet Take as directed.  6 each  0  . calcium carbonate (TUMS EX) 750 MG chewable tablet Chew 1 tablet by mouth 2 (two) times daily as needed for heartburn.      . Calcium-Vitamin D 600-125 MG-UNIT TABS Take 1 tablet by mouth 2 (two) times daily.        Marland Kitchen denosumab (PROLIA) 60 MG/ML SOLN injection Inject 60 mg into the skin every 6 (six) months. Administer in upper arm, thigh, or abdomen      . Fluticasone-Salmeterol (ADVAIR) 100-50 MCG/DOSE AEPB Inhale 1 puff into the lungs at bedtime as needed.      . loratadine (CLARITIN) 10 MG tablet Take 10 mg by mouth every morning.       . miconazole (MICOTIN) 2 % cream Apply 1 application topically 2 (two) times daily as needed (vulva itching.).      Marland Kitchen mometasone (NASONEX) 50 MCG/ACT nasal spray Place 2  sprays into the nose at bedtime.      . Multiple Vitamin (MULTIVITAMIN) capsule Take 1 capsule by mouth every morning.       Marland Kitchen MYRBETRIQ 25 MG TB24 Take 25 mg by mouth every morning.       Marland Kitchen omeprazole (PRILOSEC OTC) 20 MG tablet Take 20 mg by mouth every morning.      Marland Kitchen oxyCODONE-acetaminophen (ROXICET) 5-325 MG per tablet Take 1-2 tablets by mouth every 6 (six) hours as needed for moderate pain.  30 tablet  0   No current facility-administered medications on file prior to visit.

## 2014-04-09 NOTE — Telephone Encounter (Signed)
Ok to take her xanax up to 4x daily if needed through this weekend as needed for jitters. Would like to stay off antibiotics to let stomach settle down.

## 2014-04-12 ENCOUNTER — Encounter: Payer: Self-pay | Admitting: Gynecologic Oncology

## 2014-04-12 ENCOUNTER — Ambulatory Visit: Payer: Medicare Other | Admitting: Oncology

## 2014-04-15 ENCOUNTER — Encounter: Payer: Self-pay | Admitting: Internal Medicine

## 2014-04-23 ENCOUNTER — Other Ambulatory Visit: Payer: Self-pay | Admitting: Family Medicine

## 2014-04-23 ENCOUNTER — Ambulatory Visit
Admission: RE | Admit: 2014-04-23 | Discharge: 2014-04-23 | Disposition: A | Discharge status: Home / Self Care | Payer: Medicare Other | Source: Ambulatory Visit | Attending: Family Medicine | Admitting: Family Medicine

## 2014-04-23 DIAGNOSIS — R059 Cough, unspecified: Secondary | ICD-10-CM

## 2014-04-23 DIAGNOSIS — R05 Cough: Secondary | ICD-10-CM

## 2014-04-28 ENCOUNTER — Telehealth: Payer: Self-pay | Admitting: *Deleted

## 2014-04-28 ENCOUNTER — Telehealth: Payer: Self-pay | Admitting: Oncology

## 2014-04-28 NOTE — Telephone Encounter (Signed)
pt left vm stating wanting to r/s appt-adv pt [facility] had no appt until late Feb-pt wanting to discuss issues w/his nurse-trans to [facility]. Adv pt if current issue i could trns to triage nurse or i couls sch w/HF-pt wanted [facility] nurse

## 2014-04-28 NOTE — Telephone Encounter (Signed)
Pt called to this RN to state she was advised by her primary MD to hold her anastrazole for 1 week due to ongoing nausea.  " Is that ok ?"  This RN informed pt above is ok- but also discussed her nausea further.  Pt states " I don't think it is the anastrazole because I have been on it a while before the nausea started " " the nausea started after I had a lot of lung congestion and was put on a Z pak "  This RN reviewed with pt antibiotic possible cause of nausea - discussed her current meds for symptom management which pt states " does not really seem to help"  This RN discussed with pt use of probiotics which may help help- including use of yogurts with probiotics due to pt stating she is loosing weight secondary " not being able to eat ".  Plan is pt will stay off the anastrazole x 1 week.  Continue current meds per primary MD.  Add yogurt with probiotics.  Call this RN in one week with update.

## 2014-04-29 ENCOUNTER — Other Ambulatory Visit (HOSPITAL_COMMUNITY): Payer: Self-pay | Admitting: Family Medicine

## 2014-04-29 DIAGNOSIS — R110 Nausea: Secondary | ICD-10-CM

## 2014-05-12 ENCOUNTER — Ambulatory Visit (HOSPITAL_COMMUNITY): Payer: Medicare Other

## 2014-05-28 ENCOUNTER — Telehealth: Payer: Self-pay | Admitting: *Deleted

## 2014-05-28 NOTE — Telephone Encounter (Signed)
Pt called to this RN to state she has resumed taking anastrazole today per being off for approximately 2 months due to onset of nausea.  Per her primary MD she stopped all her medications and proceeded to a GI consult.  GI workup was negative and it was recommended that she resume medications 1 at a time weekly to see if nausea recurs.  No other needs at this time but pt did state that she needs to reschedule cancelled appointment in November ( due to above scenerio).

## 2014-05-31 ENCOUNTER — Telehealth: Payer: Self-pay | Admitting: Oncology

## 2014-05-31 NOTE — Telephone Encounter (Signed)
per pof to sch pt appt-cld & tlakwed w/pt and gave pt r/s time & date

## 2014-06-11 DIAGNOSIS — J219 Acute bronchiolitis, unspecified: Secondary | ICD-10-CM

## 2014-06-11 HISTORY — DX: Acute bronchiolitis, unspecified: J21.9

## 2014-06-16 ENCOUNTER — Inpatient Hospital Stay (HOSPITAL_COMMUNITY)
Admission: EM | Admit: 2014-06-16 | Discharge: 2014-06-18 | DRG: 202 | Disposition: A | Discharge status: Home / Self Care | Payer: Medicare Other | Attending: Internal Medicine | Admitting: Internal Medicine

## 2014-06-16 ENCOUNTER — Emergency Department (HOSPITAL_COMMUNITY): Payer: Medicare Other

## 2014-06-16 ENCOUNTER — Encounter (HOSPITAL_COMMUNITY): Payer: Self-pay | Admitting: Emergency Medicine

## 2014-06-16 DIAGNOSIS — J440 Chronic obstructive pulmonary disease with acute lower respiratory infection: Secondary | ICD-10-CM | POA: Diagnosis present

## 2014-06-16 DIAGNOSIS — I10 Essential (primary) hypertension: Secondary | ICD-10-CM | POA: Diagnosis not present

## 2014-06-16 DIAGNOSIS — E876 Hypokalemia: Secondary | ICD-10-CM | POA: Diagnosis present

## 2014-06-16 DIAGNOSIS — J45909 Unspecified asthma, uncomplicated: Secondary | ICD-10-CM | POA: Diagnosis present

## 2014-06-16 DIAGNOSIS — M810 Age-related osteoporosis without current pathological fracture: Secondary | ICD-10-CM | POA: Diagnosis present

## 2014-06-16 DIAGNOSIS — Z9849 Cataract extraction status, unspecified eye: Secondary | ICD-10-CM | POA: Diagnosis not present

## 2014-06-16 DIAGNOSIS — Z882 Allergy status to sulfonamides status: Secondary | ICD-10-CM

## 2014-06-16 DIAGNOSIS — R627 Adult failure to thrive: Secondary | ICD-10-CM | POA: Diagnosis present

## 2014-06-16 DIAGNOSIS — Z888 Allergy status to other drugs, medicaments and biological substances status: Secondary | ICD-10-CM

## 2014-06-16 DIAGNOSIS — F419 Anxiety disorder, unspecified: Secondary | ICD-10-CM | POA: Diagnosis present

## 2014-06-16 DIAGNOSIS — F329 Major depressive disorder, single episode, unspecified: Secondary | ICD-10-CM | POA: Diagnosis present

## 2014-06-16 DIAGNOSIS — Z87891 Personal history of nicotine dependence: Secondary | ICD-10-CM

## 2014-06-16 DIAGNOSIS — R0602 Shortness of breath: Secondary | ICD-10-CM | POA: Diagnosis not present

## 2014-06-16 DIAGNOSIS — J449 Chronic obstructive pulmonary disease, unspecified: Secondary | ICD-10-CM | POA: Diagnosis present

## 2014-06-16 DIAGNOSIS — J209 Acute bronchitis, unspecified: Principal | ICD-10-CM | POA: Diagnosis present

## 2014-06-16 DIAGNOSIS — J42 Unspecified chronic bronchitis: Secondary | ICD-10-CM

## 2014-06-16 DIAGNOSIS — H532 Diplopia: Secondary | ICD-10-CM | POA: Diagnosis present

## 2014-06-16 DIAGNOSIS — J9601 Acute respiratory failure with hypoxia: Secondary | ICD-10-CM | POA: Diagnosis present

## 2014-06-16 DIAGNOSIS — Z681 Body mass index (BMI) 19 or less, adult: Secondary | ICD-10-CM

## 2014-06-16 DIAGNOSIS — J309 Allergic rhinitis, unspecified: Secondary | ICD-10-CM | POA: Diagnosis present

## 2014-06-16 DIAGNOSIS — Z853 Personal history of malignant neoplasm of breast: Secondary | ICD-10-CM | POA: Diagnosis not present

## 2014-06-16 DIAGNOSIS — E440 Moderate protein-calorie malnutrition: Secondary | ICD-10-CM | POA: Insufficient documentation

## 2014-06-16 DIAGNOSIS — M1990 Unspecified osteoarthritis, unspecified site: Secondary | ICD-10-CM | POA: Diagnosis present

## 2014-06-16 DIAGNOSIS — E46 Unspecified protein-calorie malnutrition: Secondary | ICD-10-CM | POA: Diagnosis present

## 2014-06-16 DIAGNOSIS — Z7982 Long term (current) use of aspirin: Secondary | ICD-10-CM | POA: Diagnosis not present

## 2014-06-16 DIAGNOSIS — R0902 Hypoxemia: Secondary | ICD-10-CM

## 2014-06-16 DIAGNOSIS — I248 Other forms of acute ischemic heart disease: Secondary | ICD-10-CM | POA: Diagnosis present

## 2014-06-16 DIAGNOSIS — Z885 Allergy status to narcotic agent status: Secondary | ICD-10-CM

## 2014-06-16 DIAGNOSIS — R000 Tachycardia, unspecified: Secondary | ICD-10-CM | POA: Diagnosis present

## 2014-06-16 HISTORY — DX: Acute bronchiolitis, unspecified: J21.9

## 2014-06-16 LAB — CBC WITH DIFFERENTIAL/PLATELET
BASOS ABS: 0 10*3/uL (ref 0.0–0.1)
Basophils Absolute: 0 10*3/uL (ref 0.0–0.1)
Basophils Relative: 0 % (ref 0–1)
Basophils Relative: 0 % (ref 0–1)
EOS ABS: 0 10*3/uL (ref 0.0–0.7)
EOS PCT: 0 % (ref 0–5)
EOS PCT: 0 % (ref 0–5)
Eosinophils Absolute: 0 10*3/uL (ref 0.0–0.7)
HCT: 42.8 % (ref 36.0–46.0)
HCT: 41.5 % (ref 36.0–46.0)
Hemoglobin: 14.2 g/dL (ref 12.0–15.0)
Hemoglobin: 13.8 g/dL (ref 12.0–15.0)
LYMPHS ABS: 1 10*3/uL (ref 0.7–4.0)
Lymphocytes Relative: 17 % (ref 12–46)
Lymphocytes Relative: 10 % — ABNORMAL LOW (ref 12–46)
Lymphs Abs: 1.3 10*3/uL (ref 0.7–4.0)
MCH: 30.1 pg (ref 26.0–34.0)
MCH: 30.8 pg (ref 26.0–34.0)
MCHC: 33.2 g/dL (ref 30.0–36.0)
MCHC: 33.3 g/dL (ref 30.0–36.0)
MCV: 90.7 fL (ref 78.0–100.0)
MCV: 92.6 fL (ref 78.0–100.0)
MONO ABS: 1.1 10*3/uL — AB (ref 0.1–1.0)
MONOS PCT: 13 % — AB (ref 3–12)
MONOS PCT: 12 % (ref 3–12)
Monocytes Absolute: 1.2 10*3/uL — ABNORMAL HIGH (ref 0.1–1.0)
NEUTROS ABS: 7.7 10*3/uL (ref 1.7–7.7)
Neutro Abs: 5.5 10*3/uL (ref 1.7–7.7)
Neutrophils Relative %: 70 % (ref 43–77)
Neutrophils Relative %: 78 % — ABNORMAL HIGH (ref 43–77)
PLATELETS: 231 10*3/uL (ref 150–400)
Platelets: 208 10*3/uL (ref 150–400)
RBC: 4.72 MIL/uL (ref 3.87–5.11)
RBC: 4.48 MIL/uL (ref 3.87–5.11)
RDW: 13.5 % (ref 11.5–15.5)
RDW: 13.7 % (ref 11.5–15.5)
WBC: 7.9 10*3/uL (ref 4.0–10.5)
WBC: 9.9 10*3/uL (ref 4.0–10.5)

## 2014-06-16 LAB — COMPREHENSIVE METABOLIC PANEL
ALK PHOS: 67 U/L (ref 39–117)
ALT: 10 U/L (ref 0–35)
AST: 27 U/L (ref 0–37)
Albumin: 3.1 g/dL — ABNORMAL LOW (ref 3.5–5.2)
Anion gap: 13 (ref 5–15)
BUN: 8 mg/dL (ref 6–23)
CO2: 19 mmol/L (ref 19–32)
CREATININE: 0.89 mg/dL (ref 0.50–1.10)
Calcium: 7.8 mg/dL — ABNORMAL LOW (ref 8.4–10.5)
Chloride: 105 mEq/L (ref 96–112)
GFR, EST AFRICAN AMERICAN: 66 mL/min — AB (ref >90)
GFR, EST NON AFRICAN AMERICAN: 57 mL/min — AB (ref >90)
Glucose, Bld: 146 mg/dL — ABNORMAL HIGH (ref 70–99)
POTASSIUM: 3.6 mmol/L (ref 3.5–5.1)
SODIUM: 137 mmol/L (ref 135–145)
Total Bilirubin: 0.3 mg/dL (ref 0.3–1.2)
Total Protein: 6.4 g/dL (ref 6.0–8.3)

## 2014-06-16 LAB — I-STAT CHEM 8, ED
BUN: 10 mg/dL (ref 6–23)
Calcium, Ion: 1 mmol/L — ABNORMAL LOW (ref 1.13–1.30)
Chloride: 100 mEq/L (ref 96–112)
Creatinine, Ser: 0.7 mg/dL (ref 0.50–1.10)
GLUCOSE: 116 mg/dL — AB (ref 70–99)
HCT: 46 % (ref 36.0–46.0)
Hemoglobin: 15.6 g/dL — ABNORMAL HIGH (ref 12.0–15.0)
Potassium: 3.3 mmol/L — ABNORMAL LOW (ref 3.5–5.1)
Sodium: 137 mmol/L (ref 135–145)
TCO2: 23 mmol/L (ref 0–100)

## 2014-06-16 LAB — TROPONIN I
TROPONIN I: 0.06 ng/mL — AB (ref <0.031)
Troponin I: <0.03 ng/mL (ref <0.031)
Troponin I: 0.05 ng/mL — ABNORMAL HIGH (ref <0.031)

## 2014-06-16 LAB — INFLUENZA PANEL BY PCR (TYPE A & B)
H1N1 flu by pcr: NOT DETECTED
Influenza A By PCR: NEGATIVE
Influenza B By PCR: NEGATIVE

## 2014-06-16 MED ORDER — FLUTICASONE PROPIONATE 50 MCG/ACT NA SUSP
2.0000 | Freq: Every day | NASAL | Status: DC
  Administered 2014-06-16: 2 via NASAL
  Filled 2014-06-16 (×2): qty 16

## 2014-06-16 MED ORDER — ANASTROZOLE 1 MG PO TABS
1.0000 mg | ORAL_TABLET | Freq: Every day | ORAL | Status: DC
  Administered 2014-06-16 – 2014-06-18 (×3): 1 mg via ORAL
  Filled 2014-06-16 (×3): qty 1

## 2014-06-16 MED ORDER — ENOXAPARIN SODIUM 40 MG/0.4ML ~~LOC~~ SOLN
40.0000 mg | SUBCUTANEOUS | Status: DC
  Administered 2014-06-16 – 2014-06-18 (×3): 40 mg via SUBCUTANEOUS
  Filled 2014-06-16 (×4): qty 0.4

## 2014-06-16 MED ORDER — LORATADINE 10 MG PO TABS
10.0000 mg | ORAL_TABLET | Freq: Every morning | ORAL | Status: DC
  Administered 2014-06-16 – 2014-06-18 (×3): 10 mg via ORAL
  Filled 2014-06-16 (×3): qty 1

## 2014-06-16 MED ORDER — LEVALBUTEROL HCL 0.63 MG/3ML IN NEBU: 0.6300 mg | INHALATION_SOLUTION | Freq: Four times a day (QID) | RESPIRATORY_TRACT | Status: DC | PRN

## 2014-06-16 MED ORDER — LEVALBUTEROL HCL 0.63 MG/3ML IN NEBU
0.6300 mg | INHALATION_SOLUTION | Freq: Four times a day (QID) | RESPIRATORY_TRACT | Status: DC
  Administered 2014-06-16: 0.63 mg via RESPIRATORY_TRACT
  Filled 2014-06-16: qty 3

## 2014-06-16 MED ORDER — BUDESONIDE 0.25 MG/2ML IN SUSP
0.2500 mg | Freq: Two times a day (BID) | RESPIRATORY_TRACT | Status: DC
  Administered 2014-06-16 – 2014-06-17 (×3): 0.25 mg via RESPIRATORY_TRACT
  Filled 2014-06-16 (×7): qty 2

## 2014-06-16 MED ORDER — LEVOFLOXACIN 750 MG PO TABS
750.0000 mg | ORAL_TABLET | ORAL | Status: DC
  Administered 2014-06-16 – 2014-06-18 (×2): 750 mg via ORAL
  Filled 2014-06-16 (×2): qty 1

## 2014-06-16 MED ORDER — ONDANSETRON HCL 4 MG/2ML IJ SOLN: 4.0000 mg | Freq: Four times a day (QID) | INTRAMUSCULAR | Status: DC | PRN

## 2014-06-16 MED ORDER — DEXTROSE 5 % IV SOLN
2.0000 g | Freq: Once | INTRAVENOUS | Status: AC
  Administered 2014-06-16: 2 g via INTRAVENOUS
  Filled 2014-06-16: qty 2

## 2014-06-16 MED ORDER — MULTIVITAMINS PO CAPS: 1.0000 | ORAL_CAPSULE | Freq: Every morning | ORAL | Status: DC

## 2014-06-16 MED ORDER — SODIUM CHLORIDE 0.9 % IV BOLUS (SEPSIS)
1000.0000 mL | Freq: Once | INTRAVENOUS | Status: AC
Start: 2014-06-16 — End: 2014-06-16
  Administered 2014-06-16: 1000 mL via INTRAVENOUS

## 2014-06-16 MED ORDER — POTASSIUM CHLORIDE CRYS ER 20 MEQ PO TBCR
20.0000 meq | EXTENDED_RELEASE_TABLET | Freq: Once | ORAL | Status: AC
  Administered 2014-06-16: 20 meq via ORAL
  Filled 2014-06-16: qty 1

## 2014-06-16 MED ORDER — ALBUTEROL (5 MG/ML) CONTINUOUS INHALATION SOLN
10.0000 mg/h | INHALATION_SOLUTION | RESPIRATORY_TRACT | Status: DC
  Administered 2014-06-16: 10 mg/h via RESPIRATORY_TRACT
  Filled 2014-06-16: qty 20

## 2014-06-16 MED ORDER — ALPRAZOLAM 0.25 MG PO TABS
0.2500 mg | ORAL_TABLET | Freq: Two times a day (BID) | ORAL | Status: DC | PRN
  Administered 2014-06-17 – 2014-06-18 (×2): 0.25 mg via ORAL
  Filled 2014-06-16 (×2): qty 1

## 2014-06-16 MED ORDER — ASPIRIN EC 81 MG PO TBEC
81.0000 mg | DELAYED_RELEASE_TABLET | Freq: Every day | ORAL | Status: DC
  Administered 2014-06-16 – 2014-06-18 (×3): 81 mg via ORAL
  Filled 2014-06-16 (×3): qty 1

## 2014-06-16 MED ORDER — ADULT MULTIVITAMIN W/MINERALS CH
1.0000 | ORAL_TABLET | Freq: Every day | ORAL | Status: DC
  Administered 2014-06-16 – 2014-06-18 (×3): 1 via ORAL
  Filled 2014-06-16 (×3): qty 1

## 2014-06-16 MED ORDER — POTASSIUM CHLORIDE CRYS ER 20 MEQ PO TBCR
40.0000 meq | EXTENDED_RELEASE_TABLET | Freq: Once | ORAL | Status: AC
  Administered 2014-06-16: 40 meq via ORAL
  Filled 2014-06-16: qty 2

## 2014-06-16 MED ORDER — ACETAMINOPHEN 650 MG RE SUPP
650.0000 mg | Freq: Four times a day (QID) | RECTAL | Status: DC | PRN
Start: 2014-06-16 — End: 2014-06-18

## 2014-06-16 MED ORDER — SODIUM CHLORIDE 0.9 % IV SOLN
INTRAVENOUS | Status: DC
  Administered 2014-06-16: 09:00:00 via INTRAVENOUS

## 2014-06-16 MED ORDER — IPRATROPIUM-ALBUTEROL 0.5-2.5 (3) MG/3ML IN SOLN: 3.0000 mL | RESPIRATORY_TRACT | Status: DC | PRN

## 2014-06-16 MED ORDER — MIRABEGRON ER 25 MG PO TB24
25.0000 mg | ORAL_TABLET | Freq: Every morning | ORAL | Status: DC
  Administered 2014-06-16 – 2014-06-18 (×3): 25 mg via ORAL
  Filled 2014-06-16 (×3): qty 1

## 2014-06-16 MED ORDER — AMLODIPINE BESYLATE 5 MG PO TABS
5.0000 mg | ORAL_TABLET | Freq: Every morning | ORAL | Status: DC
  Administered 2014-06-16 – 2014-06-18 (×3): 5 mg via ORAL
  Filled 2014-06-16 (×3): qty 1

## 2014-06-16 MED ORDER — ONDANSETRON HCL 4 MG PO TABS
4.0000 mg | ORAL_TABLET | Freq: Four times a day (QID) | ORAL | Status: DC | PRN
  Filled 2014-06-16: qty 1

## 2014-06-16 MED ORDER — ACETAMINOPHEN 325 MG PO TABS
650.0000 mg | ORAL_TABLET | Freq: Four times a day (QID) | ORAL | Status: DC | PRN
  Administered 2014-06-16: 650 mg via ORAL
  Filled 2014-06-16: qty 2

## 2014-06-16 MED ORDER — OMEPRAZOLE MAGNESIUM 20 MG PO TBEC: 20.0000 mg | DELAYED_RELEASE_TABLET | Freq: Every morning | ORAL | Status: DC

## 2014-06-16 MED ORDER — PANTOPRAZOLE SODIUM 40 MG PO TBEC
40.0000 mg | DELAYED_RELEASE_TABLET | Freq: Every day | ORAL | Status: DC
  Administered 2014-06-16 – 2014-06-18 (×3): 40 mg via ORAL
  Filled 2014-06-16: qty 1

## 2014-06-16 MED ORDER — OXYCODONE-ACETAMINOPHEN 5-325 MG PO TABS: 1.0000 | ORAL_TABLET | Freq: Four times a day (QID) | ORAL | Status: DC | PRN

## 2014-06-16 MED ORDER — SODIUM CHLORIDE 0.9 % IJ SOLN
3.0000 mL | Freq: Two times a day (BID) | INTRAMUSCULAR | Status: DC
  Administered 2014-06-16 – 2014-06-17 (×3): 3 mL via INTRAVENOUS

## 2014-06-16 MED ORDER — IOHEXOL 350 MG/ML SOLN
80.0000 mL | Freq: Once | INTRAVENOUS | Status: AC | PRN
  Administered 2014-06-16: 80 mL via INTRAVENOUS

## 2014-06-16 MED ORDER — AZITHROMYCIN 250 MG PO TABS
500.0000 mg | ORAL_TABLET | Freq: Once | ORAL | Status: AC
  Administered 2014-06-16: 500 mg via ORAL
  Filled 2014-06-16: qty 2

## 2014-06-16 MED ORDER — IPRATROPIUM-ALBUTEROL 0.5-2.5 (3) MG/3ML IN SOLN
3.0000 mL | Freq: Three times a day (TID) | RESPIRATORY_TRACT | Status: DC
  Administered 2014-06-16 – 2014-06-17 (×4): 3 mL via RESPIRATORY_TRACT
  Filled 2014-06-16 (×6): qty 3

## 2014-06-16 MED ORDER — IPRATROPIUM BROMIDE 0.02 % IN SOLN
0.5000 mg | Freq: Four times a day (QID) | RESPIRATORY_TRACT | Status: DC
  Administered 2014-06-16: 0.5 mg via RESPIRATORY_TRACT
  Filled 2014-06-16: qty 2.5

## 2014-06-16 MED ORDER — LEVOFLOXACIN 750 MG PO TABS
750.0000 mg | ORAL_TABLET | Freq: Every day | ORAL | Status: DC
  Filled 2014-06-16: qty 1

## 2014-06-16 MED ORDER — ONDANSETRON HCL 4 MG/2ML IJ SOLN
4.0000 mg | Freq: Once | INTRAMUSCULAR | Status: AC
  Administered 2014-06-16: 4 mg via INTRAVENOUS
  Filled 2014-06-16: qty 2

## 2014-06-16 NOTE — H&P (Signed)
[facility] History and Physical  Rhonda Bryant GOT:[id number] DOB: 1925/04/02 DOA: 06/16/2014  Referring physician: ER physician. PCP: [name], MD   Chief Complaint: Shortness of breath and cough.  HPI: Rhonda Bryant is a 81 y.o. female with history of COPD, hypertension and history of breast cancer in remission presents to the ER because of shortness of breath with productive cough over the last 3 days. Patient also has been having occasionally chest pain on coughing. The patient was found to be hypoxic and tachycardic and CT angiogram of the chest was done which was negative for PE or pneumonia. The patient is found to be febrile. Patient hasn't been started on nebulizer and empiric antibiotics and admitted for possible bronchitis. Last evening patient had one episode of nausea vomiting and denies any abdominal pain or diarrhea.   Review of Systems: As presented in the history of presenting illness, rest negative.  Past Medical History  Diagnosis Date  . Chronic airway obstruction, not elsewhere classified   . Allergic rhinitis, cause unspecified   . Osteoporosis   . Blood transfusion without reported diagnosis     3 units with 3rd child  . Cancer   . PONV (postoperative nausea and vomiting)   . Depression   . Anxiety   . Shortness of breath     only with lots of activity  . Malignant neoplasm of breast (female), unspecified site     bilateral breast cancer ages 75 and 43  . Family history of malignant neoplasm of breast   . BRCA2 positive     p.R2520* (c.7558C>T) also known as 7786C>T  . COPD (chronic obstructive pulmonary disease)   . Asthma   . Pneumonia     had twice  . Heart murmur     slight  . Unspecified essential hypertension   . Swelling of ankle     bilateral  . Headache(784.0)   . Arthritis     neck  . Double vision     right eye   Past Surgical History  Procedure Laterality Date  . Cataract extraction Bilateral   . Tonsillectomy     . Colonoscopy    . Simple mastectomy with axillary sentinel node biopsy Right 04/03/2013    Procedure: TOTAL MASTECTOMY WITH AXILLARY SENTINEL NODE BIOPSY;  Surgeon: Edward Jolly, MD;  Location: [place];  Service: General;  Laterality: Right;  . Capsulectomy Right 04/03/2013    Procedure: TOTAL CAPSULECTOMY/REMOVAL OF IMPLANT MATERIAL RIGHT BREAST;  Surgeon: Crissie Reese, MD;  Location: [place];  Service: Plastics;  Laterality: Right;  . Mastectomy  1989    left-with lymph nodes  . Tonsillectomy  as child  . Skin cancer destruction      x2 on nose, x1 on neck  . Robotic assisted bilateral salpingo oopherectomy Bilateral 02/23/2014    Procedure: ROBOTIC ASSISTED BILATERAL SALPINGO OOPHORECTOMY;  Surgeon: Janie Morning, MD;  Location: [facility];  Service: Gynecology;  Laterality: Bilateral;   Social History:  reports that she quit smoking about 57 years ago. Her smoking use included Cigarettes. She smoked 0.00 packs per day for 4 years. She has never used smokeless tobacco. She reports that she drinks alcohol. She reports that she does not use illicit drugs. Where does patient live home. Can patient participate in ADLs? Yes.  Allergies  Allergen Reactions  . Codeine Nausea Only    REACTION: nausea  . Nsaids     Pt states to avoid this med causes nausea.  . Other  Ragweed and Dust  . Sulfamethoxazole-Trimethoprim Nausea Only    REACTION: nausea    Family History:  Family History  Problem Relation Age of Onset  . Pancreatic cancer Father     deceased 21  . Breast cancer Mother 51    deceased 27      Prior to Admission medications   Medication Sig Start Date End Date Taking? Authorizing Provider  albuterol (PROVENTIL HFA;VENTOLIN HFA) 108 (90 BASE) MCG/ACT inhaler Inhale 2 puffs into the lungs every 6 (six) hours as needed for wheezing or shortness of breath.   Yes Historical Provider, MD  ALPRAZolam (XANAX) 0.25 MG tablet Take 0.25 mg by mouth 2 (two) times daily as needed  for sleep or anxiety.    Yes Historical Provider, MD  amLODipine (NORVASC) 10 MG tablet Take 5 mg by mouth every morning.  07/02/11  Yes Historical Provider, MD  anastrozole (ARIMIDEX) 1 MG tablet Take 1 mg by mouth daily.   Yes Historical Provider, MD  denosumab (PROLIA) 60 MG/ML SOLN injection Inject 60 mg into the skin every 6 (six) months. Administer in upper arm, thigh, or abdomen   Yes Historical Provider, MD  Fluticasone-Salmeterol (ADVAIR) 100-50 MCG/DOSE AEPB Inhale 1 puff into the lungs at bedtime.    Yes Historical Provider, MD  mometasone (NASONEX) 50 MCG/ACT nasal spray Place 2 sprays into the nose at bedtime.   Yes Historical Provider, MD  omeprazole (PRILOSEC OTC) 20 MG tablet Take 20 mg by mouth every morning.   Yes Historical Provider, MD  Aloe-Sodium Chloride (AYR SALINE NASAL GEL NA) Place 1 application into the nose as needed (nasal driness.or congestion).    Historical Provider, MD  azithromycin (ZITHROMAX Z-PAK) 250 MG tablet Take as directed. Patient not taking: Reported on 06/16/2014 04/01/14   Deneise Lever, MD  calcium carbonate (TUMS EX) 750 MG chewable tablet Chew 1 tablet by mouth 2 (two) times daily as needed for heartburn.    Historical Provider, MD  Calcium-Vitamin D 600-125 MG-UNIT TABS Take 1 tablet by mouth 2 (two) times daily.      Historical Provider, MD  loratadine (CLARITIN) 10 MG tablet Take 10 mg by mouth every morning.     Historical Provider, MD  miconazole (MICOTIN) 2 % cream Apply 1 application topically 2 (two) times daily as needed (vulva itching.).    Historical Provider, MD  Multiple Vitamin (MULTIVITAMIN) capsule Take 1 capsule by mouth every morning.     Historical Provider, MD  MYRBETRIQ 25 MG TB24 Take 25 mg by mouth every morning.  07/17/12   Historical Provider, MD  oxyCODONE-acetaminophen (ROXICET) 5-325 MG per tablet Take 1-2 tablets by mouth every 6 (six) hours as needed for moderate pain. Patient not taking: Reported on 06/16/2014 02/23/14   Lahoma Crocker, MD    Physical Exam: Filed Vitals:   06/16/14 0545 06/16/14 0600 06/16/14 0615 06/16/14 0630  BP: 134/47 129/50 129/46 146/61  Pulse: 108 101 95 103  Temp:      TempSrc:      Resp: _0 Height:      Weight:      SpO2: 86% 96% 94% 96%     General:  Well-developed and nourished.  Eyes: Anicteric no pallor.  ENT: No discharge from the ears eyes nose or mouth.  Neck: No mass felt. No JVD appreciated.  Cardiovascular: S1-S2 heard tachycardic.  Respiratory: Bilateral coarse breath sounds heard.  Abdomen: Soft nontender bowel sounds present. No guarding or rigidity.  Skin:  No rash.  Musculoskeletal: No edema.  Psychiatric: Appears normal.  Neurologic: Alert awake oriented to time place and person. Moves all extremities.  Labs on Admission:  Basic Metabolic Panel:  Recent Labs Lab 06/16/14 0437  NA 137  K 3.3*  CL 100  GLUCOSE 116*  BUN 10  CREATININE 0.70   Liver Function Tests: No results for input(s): AST, ALT, ALKPHOS, BILITOT, PROT, ALBUMIN in the last 168 hours. No results for input(s): LIPASE, AMYLASE in the last 168 hours. No results for input(s): AMMONIA in the last 168 hours. CBC:  Recent Labs Lab 06/16/14 0430 06/16/14 0437  WBC 7.9  --   NEUTROABS 5.5  --   HGB 14.2 15.6*  HCT 42.8 46.0  MCV 90.7  --   PLT 208  --    Cardiac Enzymes: No results for input(s): CKTOTAL, CKMB, CKMBINDEX, TROPONINI in the last 168 hours.  BNP (last 3 results) No results for input(s): PROBNP in the last 8760 hours. CBG: No results for input(s): GLUCAP in the last 168 hours.  Radiological Exams on Admission: Dg Chest 2 View  06/16/2014   CLINICAL DATA:  Shortness of breath tonight. History of COPD, bilateral mastectomy, left-side reconstructive surgery.  EXAM: CHEST  2 VIEW  COMPARISON:  04/23/2014  FINDINGS: Normal heart size and pulmonary vascularity. Emphysematous changes in the lungs with scattered fibrosis. No focal airspace  disease or consolidation. Bilateral apical pleural thickening. Surgical clips in the left axilla. Left breast prosthesis. No blunting of costophrenic angles. No pneumothorax. Mediastinal contours appear intact.  IMPRESSION: Emphysematous changes in the lungs. No evidence of active pulmonary disease.   Electronically Signed   By: Lucienne Capers M.D.   On: 06/16/2014 04:07   Ct Angio Chest Pe W/cm &/or Wo Cm  06/16/2014   CLINICAL DATA:  Shortness of breath and mid chest pain. Cough. Symptoms beginning on Sunday. History of pneumonia and breast cancer.  EXAM: CT ANGIOGRAPHY CHEST WITH CONTRAST  TECHNIQUE: Multidetector CT imaging of the chest was performed using the standard protocol during bolus administration of intravenous contrast. Multiplanar CT image reconstructions and MIPs were obtained to evaluate the vascular anatomy.  CONTRAST:  56m OMNIPAQUE IOHEXOL 350 MG/ML SOLN  COMPARISON:  08/21/2012  FINDINGS: Technically adequate study with good opacification of the central and segmental pulmonary arteries. No focal filling defects demonstrated. No evidence of significant pulmonary embolus. Filling defect in the superior vena cava likely represent cm flow of on opacified blood.  Normal heart size. Normal caliber thoracic aorta. No aortic dissection. Great vessel origins are patent. Calcification in the aorta. Esophagus is decompressed. No significant lymphadenopathy in the chest. Surgical clips in the left axilla. Postoperative bilateral mastectomies with left breast reconstruction prosthesis.  Calcification and pleural thickening in the apices suggesting postinflammatory change. Diffuse emphysematous changes in the lungs. No focal airspace disease or consolidation. Airways are patent. No pleural effusions. No pneumothorax.  Included portions of the upper abdominal organs are grossly unremarkable except for vascular calcifications. Degenerative changes in the thoracic spine. No destructive bone lesions  appreciated.  Review of the MIP images confirms the above findings.  IMPRESSION: No evidence of significant pulmonary embolus. With emphysematous changes in the lungs with apical pleural thickening and calcifications suggesting postinflammatory change. No evidence of active pulmonary disease.   Electronically Signed   By: WLucienne CapersM.D.   On: 06/16/2014 05:21    EKG: Independently reviewed. Sinus tachycardia.  Assessment/Plan Principal Problem:   Acute bronchitis Active Problems:   COPD (  chronic obstructive pulmonary disease)   Hypertension   1. Acute bronchitis - CT angiogram of the chest does not show any definite pain or pneumonia. At this time patient has been placed on empiric antibiotics Pulmicort and nebulizer. Since patient has tachycardia I have placed patient on Xopenex. Presently patient is not actively wheezing but if he does then may need IV steroids. Check influenza PCR. 2. Atypical chest pain - EKG shows sinus tachycardia. Check troponin. 3. Hypertension - continue home medications. 4. Nausea vomiting - only one episode. Abdomen appears benign. Check LFTs. 5. COPD - see #1. 6. History of breast cancer in remission.   DVT Prophylaxis Lovenox.  Code Status: Full code.  Family Communication: None.  Disposition Plan: Admit to inpatient.    Julene Rahn N. [facility] Pager [phone].  If 7PM-7AM, please contact night-coverage [email or web address] Password [facility] 06/16/2014, 6:56 AM

## 2014-06-16 NOTE — ED Notes (Signed)
Pt c/o cough/SOB, "not feeling well" since Sunday. Pt states she has hx of PNA and feels like she may have it again. Pt also states she threw up her supper.

## 2014-06-16 NOTE — Progress Notes (Signed)
ANTIBIOTIC CONSULT NOTE - INITIAL  Pharmacy Consult for levaquin Indication: Bronchitis  Allergies  Allergen Reactions  . Codeine Nausea Only    REACTION: nausea  . Nsaids     Pt states to avoid this med causes nausea.  . Other     Ragweed and Dust  . Sulfamethoxazole-Trimethoprim Nausea Only    REACTION: nausea    Patient Measurements: Height: 5\' 4"  (162.6 cm) Weight: 102 lb 8 oz (46.494 kg) IBW/kg (Calculated) : 54.7  Vital Signs: Temp: 98.6 F (37 C) (01/06 0705) Temp Source: Oral (01/06 0705) BP: 130/65 mmHg (01/06 0705) Pulse Rate: 102 (01/06 0705) Intake/Output from previous day:   Intake/Output from this shift: Total I/O In: 120 [P.O.:120] Out: 150 [Urine:150]  Labs:  Recent Labs  06/16/14 0430 06/16/14 0437 06/16/14 0718  WBC 7.9  --  9.9  HGB 14.2 15.6* 13.8  PLT 208  --  231  CREATININE  --  0.70 0.89   Estimated Creatinine Clearance: 32.7 mL/min (by C-G formula based on Cr of 0.89). No results for input(s): VANCOTROUGH, VANCOPEAK, VANCORANDOM, GENTTROUGH, GENTPEAK, GENTRANDOM, TOBRATROUGH, TOBRAPEAK, TOBRARND, AMIKACINPEAK, AMIKACINTROU, AMIKACIN in the last 72 hours.   Microbiology: No results found for this or any previous visit (from the past 720 hour(s)).  Medical History: Past Medical History  Diagnosis Date  . Chronic airway obstruction, not elsewhere classified   . Allergic rhinitis, cause unspecified   . Osteoporosis   . Blood transfusion without reported diagnosis     3 units with 3rd child  . Cancer   . PONV (postoperative nausea and vomiting)   . Depression   . Anxiety   . Shortness of breath     only with lots of activity  . Malignant neoplasm of breast (female), unspecified site     bilateral breast cancer ages 23 and 44  . Family history of malignant neoplasm of breast   . BRCA2 positive     p.R2520* (c.7558C>T) also known as 7786C>T  . COPD (chronic obstructive pulmonary disease)   . Asthma   . Pneumonia     had  twice  . Heart murmur     slight  . Unspecified essential hypertension   . Swelling of ankle     bilateral  . Headache(784.0)   . Arthritis     neck  . Double vision     right eye  . Bronchiolitis, acute 06/2014   Assessment: 34 YOF with hx of COPD presented with SOB and productive cough over the past 3 days. Pharmacy is consulted to start levaquin for bronchiolitis. Scr 0.89, est. crcl ~ 35 ml/min. No cultures  Plan:  - Levaquin 750 mg PO Q 48 hrs - Recommend 5 - 7 days for bronchiolitis. - Monitor renal function and adjust dose if needed  Maryanna Shape, PharmD, BCPS  Clinical Pharmacist  Pager: [phone]   06/16/2014,12:54 PM

## 2014-06-16 NOTE — ED Notes (Signed)
Patient transported to CT 

## 2014-06-16 NOTE — Progress Notes (Signed)
Subjective: Feeling slightly better. Still has persistent dry cough for more than few days now.  Objective: Vital signs in last 24 hours: Filed Vitals:   06/16/14 0600 06/16/14 0615 06/16/14 0630 06/16/14 0705  BP: 129/50 129/46 146/61 130/65  Pulse: 101 95 103 102  Temp:    98.6 F (37 C)  TempSrc:    Oral  Resp: 20 20 21 20   Height:    5\' 4"  (1.626 m)  Weight:    46.494 kg (102 lb 8 oz)  SpO2: 96% 94% 96% 92%   Weight change:  No intake or output data in the 24 hours ending 06/16/14 0827  Physical Exam: General: Awake, Oriented, No acute distress. HEENT: EOMI. Neck: Supple CV: S1 and S2 Lungs: Clear to ascultation bilaterally, scatter coarse breath sounds. Abdomen: Soft, Nontender, Nondistended, +bowel sounds. Ext: Good pulses. Trace edema.  Lab Results: Basic Metabolic Panel:  Recent Labs Lab 06/16/14 0437 06/16/14 0718  NA 137 137  K 3.3* 3.6  CL 100 105  CO2  --  19  GLUCOSE 116* 146*  BUN 10 8  CREATININE 0.70 0.89  CALCIUM  --  7.8*   Liver Function Tests:  Recent Labs Lab 06/16/14 0718  AST 27  ALT 10  ALKPHOS 67  BILITOT 0.3  PROT 6.4  ALBUMIN 3.1*   No results for input(s): LIPASE, AMYLASE in the last 168 hours. No results for input(s): AMMONIA in the last 168 hours. CBC:  Recent Labs Lab 06/16/14 0430 06/16/14 0437 06/16/14 0718  WBC 7.9  --  9.9  NEUTROABS 5.5  --  7.7  HGB 14.2 15.6* 13.8  HCT 42.8 46.0 41.5  MCV 90.7  --  92.6  PLT 208  --  231   Cardiac Enzymes:  Recent Labs Lab 06/16/14 0718  TROPONINI <0.03   BNP (last 3 results) No results for input(s): PROBNP in the last 8760 hours. CBG: No results for input(s): GLUCAP in the last 168 hours. No results for input(s): HGBA1C in the last 72 hours. Other Labs: Invalid input(s): POCBNP No results for input(s): DDIMER in the last 168 hours. No results for input(s): CHOL, HDL, LDLCALC, TRIG, CHOLHDL, LDLDIRECT in the last 168 hours. No results for input(s): TSH,  T4TOTAL, T3FREE, FREET4, THYROIDAB in the last 168 hours.  Invalid input(s): FREET3 No results for input(s): VITAMINB12, FOLATE, FERRITIN, TIBC, IRON, RETICCTPCT in the last 168 hours.  Micro Results: No results found for this or any previous visit (from the past 240 hour(s)).  Studies/Results: Dg Chest 2 View  06/16/2014   CLINICAL DATA:  Shortness of breath tonight. History of COPD, bilateral mastectomy, left-side reconstructive surgery.  EXAM: CHEST  2 VIEW  COMPARISON:  04/23/2014  FINDINGS: Normal heart size and pulmonary vascularity. Emphysematous changes in the lungs with scattered fibrosis. No focal airspace disease or consolidation. Bilateral apical pleural thickening. Surgical clips in the left axilla. Left breast prosthesis. No blunting of costophrenic angles. No pneumothorax. Mediastinal contours appear intact.  IMPRESSION: Emphysematous changes in the lungs. No evidence of active pulmonary disease.   Electronically Signed   By: Lucienne Capers M.D.   On: 06/16/2014 04:07   Ct Angio Chest Pe W/cm &/or Wo Cm  06/16/2014   CLINICAL DATA:  Shortness of breath and mid chest pain. Cough. Symptoms beginning on Sunday. History of pneumonia and breast cancer.  EXAM: CT ANGIOGRAPHY CHEST WITH CONTRAST  TECHNIQUE: Multidetector CT imaging of the chest was performed using the standard protocol during bolus administration of  intravenous contrast. Multiplanar CT image reconstructions and MIPs were obtained to evaluate the vascular anatomy.  CONTRAST:  79mL OMNIPAQUE IOHEXOL 350 MG/ML SOLN  COMPARISON:  08/21/2012  FINDINGS: Technically adequate study with good opacification of the central and segmental pulmonary arteries. No focal filling defects demonstrated. No evidence of significant pulmonary embolus. Filling defect in the superior vena cava likely represent cm flow of on opacified blood.  Normal heart size. Normal caliber thoracic aorta. No aortic dissection. Great vessel origins are patent.  Calcification in the aorta. Esophagus is decompressed. No significant lymphadenopathy in the chest. Surgical clips in the left axilla. Postoperative bilateral mastectomies with left breast reconstruction prosthesis.  Calcification and pleural thickening in the apices suggesting postinflammatory change. Diffuse emphysematous changes in the lungs. No focal airspace disease or consolidation. Airways are patent. No pleural effusions. No pneumothorax.  Included portions of the upper abdominal organs are grossly unremarkable except for vascular calcifications. Degenerative changes in the thoracic spine. No destructive bone lesions appreciated.  Review of the MIP images confirms the above findings.  IMPRESSION: No evidence of significant pulmonary embolus. With emphysematous changes in the lungs with apical pleural thickening and calcifications suggesting postinflammatory change. No evidence of active pulmonary disease.   Electronically Signed   By: Lucienne Capers M.D.   On: 06/16/2014 05:21    Medications: I have reviewed the patient's current medications. Scheduled Meds: . amLODipine  5 mg Oral q morning - 10a  . anastrozole  1 mg Oral Daily  . aspirin EC  81 mg Oral Daily  . budesonide (PULMICORT) nebulizer solution  0.25 mg Nebulization BID  . enoxaparin (LOVENOX) injection  40 mg Subcutaneous Q24H  . fluticasone  2 spray Each Nare Daily  . ipratropium  0.5 mg Nebulization Q6H  . levalbuterol  0.63 mg Nebulization Q6H  . levofloxacin  750 mg Oral Q48H  . loratadine  10 mg Oral q morning - 10a  . mirabegron ER  25 mg Oral q morning - 10a  . multivitamin with minerals  1 tablet Oral Daily  . pantoprazole  40 mg Oral Daily  . potassium chloride  20 mEq Oral Once  . sodium chloride  3 mL Intravenous Q12H   Continuous Infusions: . sodium chloride     PRN Meds:.acetaminophen **OR** acetaminophen, ALPRAZolam, levalbuterol, ondansetron **OR** ondansetron (ZOFRAN) IV,  oxyCODONE-acetaminophen  Assessment/Plan: Principal Problem:   Acute bronchitis Active Problems:   COPD (chronic obstructive pulmonary disease)   Hypertension  Acute Bronthitis CT angiogram of chest negative for pulmonary embolism or pneumonia.  Patient received a dose of ceftriaxone and azithromycin, transition to levofloxacin.  Continue nebulizer therapy and Pulmicort.  Influenza PCR pending.  Atypical chest pain Likely due to above.  On telemetry, cycling cardiac enzymes.  Hypertension Stable.  Continued on home medications.  Nausea and vomiting Resolved.  COPD Management as indicated above.  History of breast cancer Stable.  DVT Prophylaxis Lovenox.  Code Status: Full code.  Family Communication: None.  Disposition Plan: Admit to inpatient.   LOS: 0 days  Navada Osterhout A, MD 06/16/2014, 8:27 AM

## 2014-06-16 NOTE — ED Provider Notes (Signed)
CSN: [id number]     Arrival date & time 06/16/14  0300 History  This chart was scribed for Everlene Balls, MD by Chester Holstein, ED Scribe. This patient was seen in room A03C/A03C and the patient's care was started at 3:32 AM.    Chief Complaint  Patient presents with  . Shortness of Breath  . Cough      Patient is a 81 y.o. female presenting with shortness of breath and cough. The history is provided by the patient. No language interpreter was used.  Shortness of Breath Associated symptoms: cough   Associated symptoms: no chest pain and no fever   Cough Associated symptoms: shortness of breath   Associated symptoms: no chest pain, no fever and no myalgias     HPI Comments: Rhonda Bryant is a 81 y.o. female with PMHx of COPD who presents to the Emergency Department complaining of productive cough with brown sputum with onset 3 days ago. Pt notes associated SOB and dyspnea.  Pt notes she has not been exposed to another sick individual. Pt notes she does not use oxygen at home. Pt denies fever, body aches, chest pain, and swelling in her legs. Nothing makes her symptoms better or worse. She denies any history of blood clots. Patient does have a history of malignancy.  Past Medical History  Diagnosis Date  . Chronic airway obstruction, not elsewhere classified   . Allergic rhinitis, cause unspecified   . Osteoporosis   . Blood transfusion without reported diagnosis     3 units with 3rd child  . Cancer   . PONV (postoperative nausea and vomiting)   . Depression   . Anxiety   . Shortness of breath     only with lots of activity  . Malignant neoplasm of breast (female), unspecified site     bilateral breast cancer ages 65 and 61  . Family history of malignant neoplasm of breast   . BRCA2 positive     p.[id number]* (c.7558C>T) also known as 7786C>T  . COPD (chronic obstructive pulmonary disease)   . Asthma   . Pneumonia     had twice  . Heart murmur     slight  . Unspecified  essential hypertension   . Swelling of ankle     bilateral  . Headache(784.0)   . Arthritis     neck  . Double vision     right eye   Past Surgical History  Procedure Laterality Date  . Cataract extraction Bilateral   . Tonsillectomy    . Colonoscopy    . Simple mastectomy with axillary sentinel node biopsy Right 04/03/2013    Procedure: TOTAL MASTECTOMY WITH AXILLARY SENTINEL NODE BIOPSY;  Surgeon: Edward Jolly, MD;  Location: [facility];  Service: General;  Laterality: Right;  . Capsulectomy Right 04/03/2013    Procedure: TOTAL CAPSULECTOMY/REMOVAL OF IMPLANT MATERIAL RIGHT BREAST;  Surgeon: Crissie Reese, MD;  Location: [facility];  Service: Plastics;  Laterality: Right;  . Mastectomy  1989    left-with lymph nodes  . Tonsillectomy  as child  . Skin cancer destruction      x2 on nose, x1 on neck  . Robotic assisted bilateral salpingo oopherectomy Bilateral 02/23/2014    Procedure: ROBOTIC ASSISTED BILATERAL SALPINGO OOPHORECTOMY;  Surgeon: Janie Morning, MD;  Location: [facility];  Service: Gynecology;  Laterality: Bilateral;   Family History  Problem Relation Age of Onset  . Pancreatic cancer Father     deceased 66  . Breast cancer Mother  48    deceased 6   History  Substance Use Topics  . Smoking status: Former Smoker -- 4 years    Types: Cigarettes    Quit date: 06/11/1957  . Smokeless tobacco: Never Used  . Alcohol Use: Yes     Comment: rarely   OB History    Gravida Para Term Preterm AB TAB SAB Ectopic Multiple Living   '3 3        3     '$ Review of Systems  Constitutional: Negative for fever.  Respiratory: Positive for cough and shortness of breath.   Cardiovascular: Negative for chest pain and leg swelling.  Musculoskeletal: Negative for myalgias.   A complete 10 system review of systems was obtained and all systems are negative except as noted in the HPI and PMH.     Allergies  Codeine; Nsaids; Other; and Sulfamethoxazole-trimethoprim  Home Medications    Prior to Admission medications   Medication Sig Start Date End Date Taking? Authorizing Provider  albuterol (PROVENTIL HFA;VENTOLIN HFA) 108 (90 BASE) MCG/ACT inhaler Inhale 2 puffs into the lungs every 6 (six) hours as needed for wheezing or shortness of breath.   Yes Historical Provider, MD  ALPRAZolam (XANAX) 0.25 MG tablet Take 0.25 mg by mouth 2 (two) times daily as needed for sleep or anxiety.    Yes Historical Provider, MD  amLODipine (NORVASC) 10 MG tablet Take 5 mg by mouth every morning.  07/02/11  Yes Historical Provider, MD  anastrozole (ARIMIDEX) 1 MG tablet Take 1 mg by mouth daily.   Yes Historical Provider, MD  denosumab (PROLIA) 60 MG/ML SOLN injection Inject 60 mg into the skin every 6 (six) months. Administer in upper arm, thigh, or abdomen   Yes Historical Provider, MD  Fluticasone-Salmeterol (ADVAIR) 100-50 MCG/DOSE AEPB Inhale 1 puff into the lungs at bedtime.    Yes Historical Provider, MD  mometasone (NASONEX) 50 MCG/ACT nasal spray Place 2 sprays into the nose at bedtime.   Yes Historical Provider, MD  omeprazole (PRILOSEC OTC) 20 MG tablet Take 20 mg by mouth every morning.   Yes Historical Provider, MD  Aloe-Sodium Chloride (AYR SALINE NASAL GEL NA) Place 1 application into the nose as needed (nasal driness.or congestion).    Historical Provider, MD  azithromycin (ZITHROMAX Z-PAK) 250 MG tablet Take as directed. Patient not taking: Reported on 06/16/2014 04/01/14   Deneise Lever, MD  calcium carbonate (TUMS EX) 750 MG chewable tablet Chew 1 tablet by mouth 2 (two) times daily as needed for heartburn.    Historical Provider, MD  Calcium-Vitamin D 600-125 MG-UNIT TABS Take 1 tablet by mouth 2 (two) times daily.      Historical Provider, MD  loratadine (CLARITIN) 10 MG tablet Take 10 mg by mouth every morning.     Historical Provider, MD  miconazole (MICOTIN) 2 % cream Apply 1 application topically 2 (two) times daily as needed (vulva itching.).    Historical Provider, MD   Multiple Vitamin (MULTIVITAMIN) capsule Take 1 capsule by mouth every morning.     Historical Provider, MD  MYRBETRIQ 25 MG TB24 Take 25 mg by mouth every morning.  07/17/12   Historical Provider, MD  oxyCODONE-acetaminophen (ROXICET) 5-325 MG per tablet Take 1-2 tablets by mouth every 6 (six) hours as needed for moderate pain. Patient not taking: Reported on 06/16/2014 02/23/14   Lahoma Crocker, MD   BP 176/73 mmHg  Pulse 108  Temp(Src) 99.4 F (37.4 C) (Oral)  Resp 18  Ht $R'5\' 4"'wx$  (  1.626 m)  Wt 105 lb (47.628 kg)  BMI 18.01 kg/m2  SpO2 96% Physical Exam  Constitutional: She is oriented to person, place, and time. She appears well-developed and well-nourished. No distress.  HENT:  Head: Normocephalic and atraumatic.  Eyes: Conjunctivae and EOM are normal. Pupils are equal, round, and reactive to light. No scleral icterus.  Neck: Normal range of motion. Neck supple. No JVD present. No tracheal deviation present. No thyromegaly present.  Cardiovascular: Regular rhythm and normal heart sounds.  Tachycardia present.  Exam reveals no gallop and no friction rub.   No murmur heard. Pulmonary/Chest: Effort normal. No respiratory distress. She has wheezes in the right upper field. She has rhonchi in the right upper field. She exhibits no tenderness.  Abdominal: Soft. Bowel sounds are normal. She exhibits no distension and no mass. There is no tenderness. There is no rebound and no guarding.  Musculoskeletal: Normal range of motion. She exhibits no edema or tenderness.  Lymphadenopathy:    She has no cervical adenopathy.  Neurological: She is alert and oriented to person, place, and time.  Skin: Skin is warm and dry. No rash noted. No erythema. No pallor.  Psychiatric: She has a normal mood and affect. Her behavior is normal.  Nursing note and vitals reviewed.   ED Course  Procedures (including critical care time) DIAGNOSTIC STUDIES: Oxygen Saturation is 96% on oxygen, normal by my  interpretation.    COORDINATION OF CARE: 3:35 AM Discussed treatment plan with patient at beside, the patient agrees with the plan and has no further questions at this time.   Labs Review Labs Reviewed  CBC WITH DIFFERENTIAL - Abnormal; Notable for the following:    Monocytes Relative 13 (*)    Monocytes Absolute 1.1 (*)    All other components within normal limits  I-STAT CHEM 8, ED - Abnormal; Notable for the following:    Potassium 3.3 (*)    Glucose, Bld 116 (*)    Calcium, Ion 1.00 (*)    Hemoglobin 15.6 (*)    All other components within normal limits    Imaging Review Dg Chest 2 View  06/16/2014   CLINICAL DATA:  Shortness of breath tonight. History of COPD, bilateral mastectomy, left-side reconstructive surgery.  EXAM: CHEST  2 VIEW  COMPARISON:  04/23/2014  FINDINGS: Normal heart size and pulmonary vascularity. Emphysematous changes in the lungs with scattered fibrosis. No focal airspace disease or consolidation. Bilateral apical pleural thickening. Surgical clips in the left axilla. Left breast prosthesis. No blunting of costophrenic angles. No pneumothorax. Mediastinal contours appear intact.  IMPRESSION: Emphysematous changes in the lungs. No evidence of active pulmonary disease.   Electronically Signed   By: Burman Nieves M.D.   On: 06/16/2014 04:07   Ct Angio Chest Pe W/cm &/or Wo Cm  06/16/2014   CLINICAL DATA:  Shortness of breath and mid chest pain. Cough. Symptoms beginning on Sunday. History of pneumonia and breast cancer.  EXAM: CT ANGIOGRAPHY CHEST WITH CONTRAST  TECHNIQUE: Multidetector CT imaging of the chest was performed using the standard protocol during bolus administration of intravenous contrast. Multiplanar CT image reconstructions and MIPs were obtained to evaluate the vascular anatomy.  CONTRAST:  86mL OMNIPAQUE IOHEXOL 350 MG/ML SOLN  COMPARISON:  08/21/2012  FINDINGS: Technically adequate study with good opacification of the central and segmental pulmonary  arteries. No focal filling defects demonstrated. No evidence of significant pulmonary embolus. Filling defect in the superior vena cava likely represent cm flow of on opacified blood.  Normal heart size. Normal caliber thoracic aorta. No aortic dissection. Great vessel origins are patent. Calcification in the aorta. Esophagus is decompressed. No significant lymphadenopathy in the chest. Surgical clips in the left axilla. Postoperative bilateral mastectomies with left breast reconstruction prosthesis.  Calcification and pleural thickening in the apices suggesting postinflammatory change. Diffuse emphysematous changes in the lungs. No focal airspace disease or consolidation. Airways are patent. No pleural effusions. No pneumothorax.  Included portions of the upper abdominal organs are grossly unremarkable except for vascular calcifications. Degenerative changes in the thoracic spine. No destructive bone lesions appreciated.  Review of the MIP images confirms the above findings.  IMPRESSION: No evidence of significant pulmonary embolus. With emphysematous changes in the lungs with apical pleural thickening and calcifications suggesting postinflammatory change. No evidence of active pulmonary disease.   Electronically Signed   By: Lucienne Capers M.D.   On: 06/16/2014 05:21     EKG Interpretation   Date/Time:  Wednesday June 16 2014 03:11:43 EST Ventricular Rate:  103 PR Interval:  170 QRS Duration: 85 QT Interval:  332 QTC Calculation: 434 R Axis:   79 Text Interpretation:  Sinus tachycardia Atrial premature complex Biatrial  enlargement Probable left ventricular hypertrophy Confirmed by Glynn Octave [phone]) on 06/16/2014 3:22:43 AM      MDM   Final diagnoses:  None    Patient presents emergency department for shortness of breath. Chest x-ray does not show any pneumonia, she is hypoxic to 91% on room air. She does not wear oxygen at home. She also has mild tachycardia as high as  110. Due to history of malignancy, pulmonary and was most possible. CTA was ordered and this will also help evaluate for any possible pneumonia. Labs and blood cultures drawn. She was given ceftriaxone and azithromycin.  CTA does not show any pneumonia or pulmonary embolism. I do not have a good exhalation for this patient's hypoxia. She states she is not on any oxygen at home. She does have borderline tachypnea with respiratory rate in the mid 20s. Patient be admitted to [facility], telemetry unit for continued management.  I personally performed the services described in this documentation, which was scribed in my presence. The recorded information has been reviewed and is accurate.      Everlene Balls, MD 06/16/14 [phone]

## 2014-06-16 NOTE — Progress Notes (Signed)
Utilization review completed.  

## 2014-06-17 DIAGNOSIS — R7989 Other specified abnormal findings of blood chemistry: Secondary | ICD-10-CM

## 2014-06-17 DIAGNOSIS — J449 Chronic obstructive pulmonary disease, unspecified: Secondary | ICD-10-CM

## 2014-06-17 DIAGNOSIS — J9601 Acute respiratory failure with hypoxia: Secondary | ICD-10-CM

## 2014-06-17 DIAGNOSIS — I10 Essential (primary) hypertension: Secondary | ICD-10-CM

## 2014-06-17 LAB — BASIC METABOLIC PANEL
Anion gap: 5 (ref 5–15)
BUN: <5 mg/dL — ABNORMAL LOW (ref 6–23)
CO2: 25 mmol/L (ref 19–32)
Calcium: 7.4 mg/dL — ABNORMAL LOW (ref 8.4–10.5)
Chloride: 109 mEq/L (ref 96–112)
Creatinine, Ser: 0.65 mg/dL (ref 0.50–1.10)
GFR calc Af Amer: 90 mL/min — ABNORMAL LOW (ref >90)
GFR, EST NON AFRICAN AMERICAN: 78 mL/min — AB (ref >90)
Glucose, Bld: 94 mg/dL (ref 70–99)
Potassium: 3.7 mmol/L (ref 3.5–5.1)
SODIUM: 139 mmol/L (ref 135–145)

## 2014-06-17 LAB — CBC
HCT: 38.7 % (ref 36.0–46.0)
HEMOGLOBIN: 12.4 g/dL (ref 12.0–15.0)
MCH: 29.3 pg (ref 26.0–34.0)
MCHC: 32 g/dL (ref 30.0–36.0)
MCV: 91.5 fL (ref 78.0–100.0)
Platelets: 217 10*3/uL (ref 150–400)
RBC: 4.23 MIL/uL (ref 3.87–5.11)
RDW: 14 % (ref 11.5–15.5)
WBC: 6.7 10*3/uL (ref 4.0–10.5)

## 2014-06-17 NOTE — Progress Notes (Addendum)
PROGRESS NOTE    Rhonda Bryant GEX:[id number] DOB: April 14, 1925 DOA: 06/16/2014 PCP: [name], MD  Primary Oncologist: Dr. Jana Hakim. Primary Pulmonologist: Dr. Baird Lyons  HPI/Brief narrative 81 y.o. female with history of COPD not on home oxygen, hypertension and history of breast cancer in remission presented to the ER because of SOB, productive cough, chest pain only on coughing, over the last 3 days. The patient was found to be febrile, hypoxic and tachycardic. CT angiogram of the chest was negative for PE or pneumonia. She was admitted with acute hypoxic respiratory failure secondary to acute bronchitis. She has had problems with chronic dry cough and intermittent nausea ongoing since September/October 2015.  Assessment/Plan:  1. Acute hypoxic respiratory failure: Attributed to acute bronchitis. CTA chest negative for PE or pneumonia. Continues to be hypoxic on room air with activity but clinically better. Continue antibiotics, oxygen and bronchodilators. No clinical bronchospasm or features suggestive of CHF. Reassess oxygen requirement in a.m. Will made close outpatient follow-up with pulmonology. 2. COPD with Acute bronchitis: Continue levofloxacin. QTC today normal (445 ms). Influenza panel PCR negative. 3. Atypical chest pain: Mostly musculoskeletal pain due to coughing. Symptomatic treatment. 4. Minimally elevated troponin: Likely demand ischemia from problem #1. No reported typical chest pain. EKG 06/17/14 without acute findings except LVH. Follow-up 2-D echo and if abnormal then consider inpatient cardiology consultation for ischemic evaluation. If echo normal, recommend outpatient follow-up with cardiology for stress testing. 5. Essential hypertension: Controlled. 6. Chronic nausea and intermittent vomiting: Unclear etiology. No vomiting in the hospital. 7. Hypokalemia: Replaced 8. History of breast cancer: Outpatient follow-up with oncology   Code Status: Full Family  Communication: None at bedside. Disposition Plan: Home when medically stable. Patient states that she has been progressively weak over the last couple of months along with poor appetite and lives with her elderly spouse who also has failure to thrive. We will get PT and nutrition evaluation.   Consultants:  None  Procedures:  None  Antibiotics:  Levofloxacin   Subjective: Denies dyspnea. Minimal dry cough but significantly improved than admission. Chronic intermittent nausea. No vomiting. For the last couple of months has been feeling weak and decreased appetite.  Objective: Filed Vitals:   06/16/14 2034 06/16/14 2103 06/17/14 0441 06/17/14 1216  BP: 132/59  134/69   Pulse: 80  88 95  Temp: 98.7 F (37.1 C)  98.9 F (37.2 C)   TempSrc: Oral  Oral   Resp: 18  18   Height:      Weight:      SpO2: 96% 89% 94% 92%    Intake/Output Summary (Last 24 hours) at 06/17/14 1227 Last data filed at 06/17/14 0800  Gross per 24 hour  Intake   1065 ml  Output   1650 ml  Net   -585 ml   Filed Weights   06/16/14 0311 06/16/14 0705  Weight: 47.628 kg (105 lb) 46.494 kg (102 lb 8 oz)     Exam:  General: Awake, Oriented, No acute distress. Sonographer performing bedside echocardiogram this morning HEENT: EOMI. Neck: Supple CV: S1 and S2, RRR. No JVD, murmurs or pedal edema. Telemetry: Sinus rhythm. Lungs: Clear to auscultation except occasional rhonchi in right base. Rest of lung fields clear to auscultation. No increased work of breathing. Abdomen: Soft, Nontender, Nondistended, +bowel sounds. Ext: Good pulses. Symmetric 5 x 5 power.   Data Reviewed: Basic Metabolic Panel:  Recent Labs Lab 06/16/14 0437 06/16/14 0718 06/17/14 0349  NA 137 137 139  K  3.3* 3.6 3.7  CL 100 105 109  CO2  --  19 25  GLUCOSE 116* 146* 94  BUN 10 8 <5*  CREATININE 0.70 0.89 0.65  CALCIUM  --  7.8* 7.4*   Liver Function Tests:  Recent Labs Lab 06/16/14 0718  AST 27  ALT 10    ALKPHOS 67  BILITOT 0.3  PROT 6.4  ALBUMIN 3.1*   No results for input(s): LIPASE, AMYLASE in the last 168 hours. No results for input(s): AMMONIA in the last 168 hours. CBC:  Recent Labs Lab 06/16/14 0430 06/16/14 0437 06/16/14 0718 06/17/14 0349  WBC 7.9  --  9.9 6.7  NEUTROABS 5.5  --  7.7  --   HGB 14.2 15.6* 13.8 12.4  HCT 42.8 46.0 41.5 38.7  MCV 90.7  --  92.6 91.5  PLT 208  --  231 217   Cardiac Enzymes:  Recent Labs Lab 06/16/14 0718 06/16/14 1327 06/16/14 1909  TROPONINI <0.03 0.06* 0.05*   BNP (last 3 results) No results for input(s): PROBNP in the last 8760 hours. CBG: No results for input(s): GLUCAP in the last 168 hours.  No results found for this or any previous visit (from the past 240 hour(s)).      Studies: Dg Chest 2 View  06/16/2014   CLINICAL DATA:  Shortness of breath tonight. History of COPD, bilateral mastectomy, left-side reconstructive surgery.  EXAM: CHEST  2 VIEW  COMPARISON:  04/23/2014  FINDINGS: Normal heart size and pulmonary vascularity. Emphysematous changes in the lungs with scattered fibrosis. No focal airspace disease or consolidation. Bilateral apical pleural thickening. Surgical clips in the left axilla. Left breast prosthesis. No blunting of costophrenic angles. No pneumothorax. Mediastinal contours appear intact.  IMPRESSION: Emphysematous changes in the lungs. No evidence of active pulmonary disease.   Electronically Signed   By: Lucienne Capers M.D.   On: 06/16/2014 04:07   Ct Angio Chest Pe W/cm &/or Wo Cm  06/16/2014   CLINICAL DATA:  Shortness of breath and mid chest pain. Cough. Symptoms beginning on Sunday. History of pneumonia and breast cancer.  EXAM: CT ANGIOGRAPHY CHEST WITH CONTRAST  TECHNIQUE: Multidetector CT imaging of the chest was performed using the standard protocol during bolus administration of intravenous contrast. Multiplanar CT image reconstructions and MIPs were obtained to evaluate the vascular anatomy.   CONTRAST:  74mL OMNIPAQUE IOHEXOL 350 MG/ML SOLN  COMPARISON:  08/21/2012  FINDINGS: Technically adequate study with good opacification of the central and segmental pulmonary arteries. No focal filling defects demonstrated. No evidence of significant pulmonary embolus. Filling defect in the superior vena cava likely represent cm flow of on opacified blood.  Normal heart size. Normal caliber thoracic aorta. No aortic dissection. Great vessel origins are patent. Calcification in the aorta. Esophagus is decompressed. No significant lymphadenopathy in the chest. Surgical clips in the left axilla. Postoperative bilateral mastectomies with left breast reconstruction prosthesis.  Calcification and pleural thickening in the apices suggesting postinflammatory change. Diffuse emphysematous changes in the lungs. No focal airspace disease or consolidation. Airways are patent. No pleural effusions. No pneumothorax.  Included portions of the upper abdominal organs are grossly unremarkable except for vascular calcifications. Degenerative changes in the thoracic spine. No destructive bone lesions appreciated.  Review of the MIP images confirms the above findings.  IMPRESSION: No evidence of significant pulmonary embolus. With emphysematous changes in the lungs with apical pleural thickening and calcifications suggesting postinflammatory change. No evidence of active pulmonary disease.   Electronically Signed  By: Lucienne Capers M.D.   On: 06/16/2014 05:21        Scheduled Meds: . amLODipine  5 mg Oral q morning - 10a  . anastrozole  1 mg Oral Daily  . aspirin EC  81 mg Oral Daily  . budesonide (PULMICORT) nebulizer solution  0.25 mg Nebulization BID  . enoxaparin (LOVENOX) injection  40 mg Subcutaneous Q24H  . fluticasone  2 spray Each Nare Daily  . ipratropium-albuterol  3 mL Nebulization TID  . levofloxacin  750 mg Oral Q48H  . loratadine  10 mg Oral q morning - 10a  . mirabegron ER  25 mg Oral q morning - 10a   . multivitamin with minerals  1 tablet Oral Daily  . pantoprazole  40 mg Oral Daily  . sodium chloride  3 mL Intravenous Q12H   Continuous Infusions:   Principal Problem:   Acute bronchitis Active Problems:   COPD (chronic obstructive pulmonary disease)   Hypertension   SOB (shortness of breath)    Time spent: 25 minutes.    Vernell Leep, MD, FACP, FHM. [facility] Pager [phone]  If 7PM-7AM, please contact night-coverage [email or web address] Password [email or web address] 06/17/2014, 12:27 PM    LOS: 1 day

## 2014-06-17 NOTE — Progress Notes (Signed)
*  PRELIMINARY RESULTS* Echocardiogram 2D Echocardiogram has been performed.  Leavy Cella 06/17/2014, 9:10 AM

## 2014-06-17 NOTE — Clinical Social Work Note (Signed)
CSW was consulted due to concerns about food availability for patient and husband.  CSW spoke with patient at bedside who stated that she has a high level of support from her church ([employer]) and church members do bring food but not everyday.  CSW made referral for [employer] on [address].  CSW spoke with RN to confirm that patient will be seen by PT and Nutrition.  CSW signing off for now.  Domenica Reamer, [place] Social Worker [phone]

## 2014-06-17 NOTE — Evaluation (Signed)
Physical Therapy Evaluation Patient Details Name: Rhonda Bryant MRN: [id number] DOB: 1925/01/25 Today's Date: 06/17/2014   History of Present Illness  Rhonda Bryant is a 81 y.o. female with history of COPD, hypertension and history of breast cancer in remission presents to the ER because of shortness of breath with productive cough over the last 3 days, pt with bronchitis  Clinical Impression  Pt pleasant but appears concerned about home situation. Pt and spouse would benefit from ALF and discussed with pt who states they cannot afford. She was working Warehouse manager as a Veterinary surgeon and spouse works Warehouse manager at [employer]. Pt moving well but fatigues easily. Pt on RA at rest sats 96% on RA with 250' ambulation sats dropped to 87% and able to maintain 93% on 1L with gait. Pt would benefit from below recommendation as well as continued therapy to maximize mobility and strength to improved pt independence and activity tolerance.      Follow Up Recommendations Home health PT;Other (comment) (aide)    Equipment Recommendations  3in1 (PT)    Recommendations for Other Services       Precautions / Restrictions Precautions Precautions: Fall      Mobility  Bed Mobility Overal bed mobility: Modified Independent                Transfers Overall transfer level: Modified independent                  Ambulation/Gait Ambulation/Gait assistance: Supervision Ambulation Distance (Feet): 350 Feet Assistive device: None Gait Pattern/deviations: Step-through pattern;Decreased stride length   Gait velocity interpretation: Below normal speed for age/gender General Gait Details: 3 standing rest breaks with cues for pursed lip breathing and energy conservation  Stairs            Wheelchair Mobility    Modified Rankin (Stroke Patients Only)       Balance Overall balance assessment: No apparent balance deficits (not formally assessed)                                           Pertinent Vitals/Pain Pain Assessment: No/denies pain    Home Living Family/patient expects to be discharged to:: Private residence Living Arrangements: Spouse/significant other Available Help at Discharge: Family;Available PRN/intermittently Type of Home: Apartment Home Access: Level entry     Home Layout: One level Home Equipment: None      Prior Function Level of Independence: Independent               Hand Dominance        Extremity/Trunk Assessment   Upper Extremity Assessment: Generalized weakness           Lower Extremity Assessment: Generalized weakness      Cervical / Trunk Assessment: Normal  Communication   Communication: No difficulties  Cognition Arousal/Alertness: Awake/alert Behavior During Therapy: WFL for tasks assessed/performed Overall Cognitive Status: Within Functional Limits for tasks assessed                      General Comments      Exercises        Assessment/Plan    PT Assessment Patient needs continued PT services  PT Diagnosis Difficulty walking;Generalized weakness   PT Problem List Decreased activity tolerance;Decreased strength;Cardiopulmonary status limiting activity  PT Treatment Interventions Gait training;Therapeutic activities;Therapeutic exercise;Patient/family education   PT Goals (Current  goals can be found in the Care Plan section) Acute Rehab PT Goals Patient Stated Goal: be able to function PT Goal Formulation: With patient Time For Goal Achievement: 07/01/14 Potential to Achieve Goals: Good    Frequency Min 3X/week   Barriers to discharge Decreased caregiver support      Co-evaluation               End of Session Equipment Utilized During Treatment: Oxygen Activity Tolerance: Patient tolerated treatment well Patient left: in chair;with call bell/phone within reach;with family/visitor present Nurse Communication: Mobility status         Time:  1138-1208 PT Time Calculation (min) (ACUTE ONLY): 30 min   Charges:   PT Evaluation $Initial PT Evaluation Tier I: 1 Procedure PT Treatments $Therapeutic Activity: 8-22 mins   PT G CodesMelford Aase 06/17/2014, 12:19 PM Elwyn Reach, [place]

## 2014-06-17 NOTE — Progress Notes (Signed)
SATURATION QUALIFICATIONS: (This note is used to comply with regulatory documentation for home oxygen)  Patient Saturations on Room Air at Rest = 96%  Patient Saturations on Room Air while Ambulating = 87%  Patient Saturations on 1 Liters of oxygen while Ambulating = 93%  Please briefly explain why patient needs home oxygen:desaturation with ambulation without supplemental O2 Elwyn Reach, [place]

## 2014-06-18 DIAGNOSIS — R0902 Hypoxemia: Secondary | ICD-10-CM

## 2014-06-18 DIAGNOSIS — J441 Chronic obstructive pulmonary disease with (acute) exacerbation: Secondary | ICD-10-CM

## 2014-06-18 DIAGNOSIS — E440 Moderate protein-calorie malnutrition: Secondary | ICD-10-CM | POA: Insufficient documentation

## 2014-06-18 LAB — BRAIN NATRIURETIC PEPTIDE: B Natriuretic Peptide: 45.7 pg/mL (ref 0.0–100.0)

## 2014-06-18 MED ORDER — ENSURE COMPLETE PO LIQD: 237.0000 mL | ORAL | Status: DC

## 2014-06-18 MED ORDER — BOOST / RESOURCE BREEZE PO LIQD
1.0000 | ORAL | Status: DC
Start: 2014-06-18 — End: 2014-06-18

## 2014-06-18 MED ORDER — LEVOFLOXACIN 500 MG PO TABS: 500.0000 mg | ORAL_TABLET | Freq: Every day | ORAL | Status: DC

## 2014-06-18 MED ORDER — BOOST PLUS PO LIQD
237.0000 mL | ORAL | Status: DC
  Filled 2014-06-18: qty 237

## 2014-06-18 NOTE — Progress Notes (Signed)
Physical Therapy Treatment Patient Details Name: Rhonda Bryant MRN: [id number] DOB: 1925/05/24 Today's Date: Jun 29, 2014    History of Present Illness AAYRA HORNBAKER is a 81 y.o. female with history of COPD, hypertension and history of breast cancer in remission presents to the ER because of shortness of breath with productive cough over the last 3 days, pt with bronchitis    PT Comments    Pt continues to be moving well. Able to reach into bottom drawer from standing to reach bag and comb, dressing independently and current limitation is fatigue. Recommend rollator for home with inclusion of energy conservation with increased activity in shorter bouts. HEP performed and encouraged. Will continue to follow.   Follow Up Recommendations  Home health PT;Other (comment)     Equipment Recommendations  3in1 (PT);Rolling walker with 5" wheels (rollator)    Recommendations for Other Services       Precautions / Restrictions Precautions Precautions: Fall    Mobility  Bed Mobility Overal bed mobility: Modified Independent                Transfers Overall transfer level: Modified independent                  Ambulation/Gait Ambulation/Gait assistance: Modified independent (Device/Increase time) Ambulation Distance (Feet): 300 Feet Assistive device: None;Rolling walker (2 wheeled) Gait Pattern/deviations: Step-through pattern;Decreased stride length   Gait velocity interpretation: Below normal speed for age/gender General Gait Details: 2 standing rests with pt using RW for last 70' back to room and stated decreased energy expenditure with use. Sats remained 93% on Ra throughout today   Stairs            Wheelchair Mobility    Modified Rankin (Stroke Patients Only)       Balance                                    Cognition Arousal/Alertness: Awake/alert Behavior During Therapy: Anxious Overall Cognitive Status: Within Functional  Limits for tasks assessed                      Exercises General Exercises - Upper Extremity Shoulder Flexion: AROM;Both;15 reps;Seated Elbow Flexion: AROM;Both;15 reps;Seated Elbow Extension: AROM;Both;15 reps;Seated General Exercises - Lower Extremity Long Arc Quad: AROM;Both;15 reps;Seated Hip ABduction/ADduction: AROM;Both;15 reps;Seated Hip Flexion/Marching: AROM;Both;15 reps;Seated    General Comments        Pertinent Vitals/Pain Pain Assessment: No/denies pain    Home Living                      Prior Function            PT Goals (current goals can now be found in the care plan section) Progress towards PT goals: Progressing toward goals    Frequency  Min 3X/week    PT Plan Current plan remains appropriate    Co-evaluation             End of Session   Activity Tolerance: Patient tolerated treatment well Patient left: in chair;with call bell/phone within reach     Time: 1100-1133 PT Time Calculation (min) (ACUTE ONLY): 33 min  Charges:  $Gait Training: 8-22 mins $Therapeutic Exercise: 8-22 mins                    G Codes:      Melford Aase June 29, 2014,  11:45 AM Elwyn Reach, [place]

## 2014-06-18 NOTE — Progress Notes (Signed)
INITIAL NUTRITION ASSESSMENT  DOCUMENTATION CODES Per approved criteria  -Non-severe (moderate) malnutrition in the context of chronic illness -Underweight  Pt meets criteria for MODERATE MALNUTRITION in the context of CHRONIC ILLNESS as evidenced by moderate muscle wasting, moderate fat wasting, and 10% weight loss in less than 4 months.  INTERVENTION: Provide Ensure Complete, Boost Plus, and Resource Breeze once daily each Provide a snack once daily Encourage PO intake  NUTRITION DIAGNOSIS: Inadequate oral intake related to poor appetite and nausea as evidenced by 10% weight loss in less than 4 months.   Goal: Pt to meet >/= 90% of their estimated nutrition needs   Monitor:  PO intake, supplement acceptance, weight trend, labs  Reason for Assessment: Consult  81 y.o. female  Admitting Dx: Acute bronchitis  ASSESSMENT: 81 y.o. female with history of COPD, hypertension and history of breast cancer in remission presents to the ER because of shortness of breath with productive cough over the last 3 days.  Pt reports that since September she has had issues with nausea, decreased appetite, and poor oral intake. She also states that preparing food is tiring so, she has been cooking less. She reports losing from 115 lbs down to current weight- 10% weight loss is severe for time frame. Pt has been eating about 50% of meals since admission and reports eating similar amounts at meals PTA. She also tries to drink Boost Plus once daily (4 oz BID) but, she finds it to be very rich. She is interested in trying additional supplements during admission. Discussed tips for increasing calories and maximizing PO intake, and reviewed some nutritious easy-to-prepare food options. Pt states she lost down to 90 lbs in the past and was able to regain 25 lbs.   Labs: low calcium, decreased GFR  Nutrition Focused Physical Exam:  Subcutaneous Fat:  Orbital Region: mild wasting Upper Arm Region: moderate  wasting Thoracic and Lumbar Region: NA  Muscle:  Temple Region: moderate wasting Clavicle Bone Region: moderate wasting Clavicle and Acromion Bone Region: mild wasting Scapular Bone Region: NA Dorsal Hand: moderate wasting Patellar Region: moderate wasting Anterior Thigh Region: moderate wasting Posterior Calf Region: moderate wasting  Edema: none noted  Height: Ht Readings from Last 1 Encounters:  06/16/14 5\' 4"  (1.626 m)    Weight: Wt Readings from Last 1 Encounters:  06/16/14 102 lb 8 oz (46.494 kg)    Ideal Body Weight: 120 lbs  % Ideal Body Weight: 85%  Wt Readings from Last 10 Encounters:  06/16/14 102 lb 8 oz (46.494 kg)  03/12/14 111 lb 6.4 oz (50.531 kg)  03/04/14 110 lb 3.2 oz (49.986 kg)  02/23/14 114 lb (51.71 kg)  02/19/14 114 lb (51.71 kg)  02/11/14 114 lb 5 oz (51.852 kg)  02/04/14 113 lb 4.8 oz (51.393 kg)  01/21/14 113 lb (51.256 kg)  01/14/14 114 lb 3.2 oz (51.801 kg)  01/13/14 115 lb (52.164 kg)    Usual Body Weight: 115 lbs  % Usual Body Weight: 89%  BMI:  Body mass index is 17.59 kg/(m^2). (Underweight)   Estimated Nutritional Needs: Kcal: 1400-1600 Protein: 70-80 grams Fluid: 1.4 L/day  Skin: intact  Diet Order: Diet Heart  EDUCATION NEEDS: -No education needs identified at this time   Intake/Output Summary (Last 24 hours) at 06/18/14 1225 Last data filed at 06/18/14 0336  Gross per 24 hour  Intake    240 ml  Output   1150 ml  Net   -910 ml    Last BM: 1/7  Labs:   Recent Labs Lab 06/16/14 0437 06/16/14 0718 06/17/14 0349  NA 137 137 139  K 3.3* 3.6 3.7  CL 100 105 109  CO2  --  19 25  BUN 10 8 <5*  CREATININE 0.70 0.89 0.65  CALCIUM  --  7.8* 7.4*  GLUCOSE 116* 146* 94    CBG (last 3)  No results for input(s): GLUCAP in the last 72 hours.  Scheduled Meds: . amLODipine  5 mg Oral q morning - 10a  . anastrozole  1 mg Oral Daily  . aspirin EC  81 mg Oral Daily  . budesonide (PULMICORT) nebulizer  solution  0.25 mg Nebulization BID  . enoxaparin (LOVENOX) injection  40 mg Subcutaneous Q24H  . [START ON 06/19/2014] feeding supplement (ENSURE COMPLETE)  237 mL Oral Q24H  . feeding supplement (RESOURCE BREEZE)  1 Container Oral Q24H  . fluticasone  2 spray Each Nare Daily  . ipratropium-albuterol  3 mL Nebulization TID  . lactose free nutrition  237 mL Oral Q24H  . levofloxacin  750 mg Oral Q48H  . loratadine  10 mg Oral q morning - 10a  . mirabegron ER  25 mg Oral q morning - 10a  . multivitamin with minerals  1 tablet Oral Daily  . pantoprazole  40 mg Oral Daily  . sodium chloride  3 mL Intravenous Q12H    Continuous Infusions:   Past Medical History  Diagnosis Date  . Chronic airway obstruction, not elsewhere classified   . Allergic rhinitis, cause unspecified   . Osteoporosis   . Blood transfusion without reported diagnosis     3 units with 3rd child  . Cancer   . PONV (postoperative nausea and vomiting)   . Depression   . Anxiety   . Shortness of breath     only with lots of activity  . Malignant neoplasm of breast (female), unspecified site     bilateral breast cancer ages 28 and 63  . Family history of malignant neoplasm of breast   . BRCA2 positive     p.[id number]* (c.7558C>T) also known as 7786C>T  . COPD (chronic obstructive pulmonary disease)   . Asthma   . Pneumonia     had twice  . Heart murmur     slight  . Unspecified essential hypertension   . Swelling of ankle     bilateral  . Headache(784.0)   . Arthritis     neck  . Double vision     right eye  . Bronchiolitis, acute 06/2014    Past Surgical History  Procedure Laterality Date  . Cataract extraction Bilateral   . Tonsillectomy    . Colonoscopy    . Simple mastectomy with axillary sentinel node biopsy Right 04/03/2013    Procedure: TOTAL MASTECTOMY WITH AXILLARY SENTINEL NODE BIOPSY;  Surgeon: Edward Jolly, MD;  Location: [facility];  Service: General;  Laterality: Right;  . Capsulectomy  Right 04/03/2013    Procedure: TOTAL CAPSULECTOMY/REMOVAL OF IMPLANT MATERIAL RIGHT BREAST;  Surgeon: Crissie Reese, MD;  Location: [facility];  Service: Plastics;  Laterality: Right;  . Mastectomy  1989    left-with lymph nodes  . Tonsillectomy  as child  . Skin cancer destruction      x2 on nose, x1 on neck  . Robotic assisted bilateral salpingo oopherectomy Bilateral 02/23/2014    Procedure: ROBOTIC ASSISTED BILATERAL SALPINGO OOPHORECTOMY;  Surgeon: Janie Morning, MD;  Location: [facility];  Service: Gynecology;  Laterality: Bilateral;  Amar Sippel Barnett RD, LDN Inpatient Clinical Dietitian Pager: [phone] After Hours Pager: [phone]  

## 2014-06-18 NOTE — Progress Notes (Signed)
Pt discharged home with husband.  Pt alert and oriented x4.  No c/o pain or shortness of breath.  Education given on diet, activity, meds, and follow-up care and instructions.  Pt verbalized understanding.  IV D/C'd.  Tele D/C'd.  Husband will transport pt home.

## 2014-06-18 NOTE — Discharge Summary (Cosign Needed)
Physician Discharge Summary  Rhonda Bryant AOZ:[id number] DOB: Aug 20, 1924 DOA: 06/16/2014  PCP: [name], MD  Admit date: 06/16/2014 Discharge date: 06/18/2014  Time spent: greater than 30 minutes  Recommendations for Outpatient Follow-up:  1. Home health RN, PT, OT, aide and social work arranged  Discharge Diagnoses:  Principal Problem:   Acute bronchitis Active Problems:   COPD (chronic obstructive pulmonary disease) exacerbation   Hypertension Acute hypoxic respiratory failure   Discharge Condition: stable  Filed Weights   06/16/14 0311 06/16/14 0705  Weight: 47.628 kg (105 lb) 46.494 kg (102 lb 8 oz)    History of present illness:  81 y.o. female with history of COPD, hypertension and history of breast cancer in remission presents to the ER because of shortness of breath with productive cough over the last 3 days. Patient also has been having occasionally chest pain on coughing. The patient was found to be hypoxic and tachycardic and CT angiogram of the chest was done which was negative for PE or pneumonia. The patient is found to be febrile. Patient hasn't been started on nebulizer and empiric antibiotics and admitted for possible bronchitis. Last evening patient had one episode of nausea vomiting and denies any abdominal pain or diarrhea  Hospital Course:  1. Acute hypoxic respiratory failure: secondary to acute bronchitis. CTA chest negative for PE or pneumonia. Continues to be hypoxic on room air with activity but clinically better. Continue antibiotics, oxygen and bronchodilators.  2. COPD with Acute bronchitis: Continue levofloxacin Influenza panel PCR negative. 3. Atypical chest pain: Mostly musculoskeletal pain due to coughing. Symptomatic treatment. 4. Minimally elevated troponin: Likely demand ischemia from problem #1. EKG 06/17/14 without acute findings except LVH. Echo with normal EF, no definite wall motion abnormalities 5. Essential hypertension:  Controlled. 6. Chronic nausea and intermittent vomiting: Unclear etiology. No vomiting in the hospital. 7. Hypokalemia: Replace  Procedures:  none  Consultations:  none  Discharge Exam: Filed Vitals:   06/18/14 1140  BP:   Pulse: 93  Temp:   Resp:     General anxious Cardiovascular: RRR Respiratory: CTA  Discharge Instructions    Current Discharge Medication List    START taking these medications   Details  levofloxacin (LEVAQUIN) 500 MG tablet Take 1 tablet (500 mg total) by mouth daily. Qty: 3 tablet, Refills: 0      CONTINUE these medications which have NOT CHANGED   Details  albuterol (PROVENTIL HFA;VENTOLIN HFA) 108 (90 BASE) MCG/ACT inhaler Inhale 2 puffs into the lungs every 6 (six) hours as needed for wheezing or shortness of breath.    ALPRAZolam (XANAX) 0.25 MG tablet Take 0.25 mg by mouth 2 (two) times daily as needed for sleep or anxiety.     amLODipine (NORVASC) 10 MG tablet Take 5 mg by mouth every morning.     anastrozole (ARIMIDEX) 1 MG tablet Take 1 mg by mouth daily.    denosumab (PROLIA) 60 MG/ML SOLN injection Inject 60 mg into the skin every 6 (six) months. Administer in upper arm, thigh, or abdomen    Fluticasone-Salmeterol (ADVAIR) 100-50 MCG/DOSE AEPB Inhale 1 puff into the lungs at bedtime.     mometasone (NASONEX) 50 MCG/ACT nasal spray Place 2 sprays into the nose at bedtime.    omeprazole (PRILOSEC OTC) 20 MG tablet Take 20 mg by mouth every morning.    Aloe-Sodium Chloride (AYR SALINE NASAL GEL NA) Place 1 application into the nose as needed (nasal driness.or congestion).    calcium carbonate (TUMS EX) 750 MG  chewable tablet Chew 1 tablet by mouth 2 (two) times daily as needed for heartburn.    Calcium-Vitamin D 600-125 MG-UNIT TABS Take 1 tablet by mouth 2 (two) times daily.      loratadine (CLARITIN) 10 MG tablet Take 10 mg by mouth every morning.     miconazole (MICOTIN) 2 % cream Apply 1 application topically 2 (two)  times daily as needed (vulva itching.).    Multiple Vitamin (MULTIVITAMIN) capsule Take 1 capsule by mouth every morning.     MYRBETRIQ 25 MG TB24 Take 25 mg by mouth every morning.       STOP taking these medications     azithromycin (ZITHROMAX Z-PAK) 250 MG tablet      oxyCODONE-acetaminophen (ROXICET) 5-325 MG per tablet        Allergies  Allergen Reactions  . Codeine Nausea Only    REACTION: nausea  . Nsaids     Pt states to avoid this med causes nausea.  . Other     Ragweed and Dust  . Sulfamethoxazole-Trimethoprim Nausea Only    REACTION: nausea   Follow-up Information    Follow up with [name], MD.   Specialty:  Family Medicine   Why:  As needed   Contact information:   [address] [place] [address] [phone]        The results of significant diagnostics from this hospitalization (including imaging, microbiology, ancillary and laboratory) are listed below for reference.    Significant Diagnostic Studies: Dg Chest 2 View  06/16/2014   CLINICAL DATA:  Shortness of breath tonight. History of COPD, bilateral mastectomy, left-side reconstructive surgery.  EXAM: CHEST  2 VIEW  COMPARISON:  04/23/2014  FINDINGS: Normal heart size and pulmonary vascularity. Emphysematous changes in the lungs with scattered fibrosis. No focal airspace disease or consolidation. Bilateral apical pleural thickening. Surgical clips in the left axilla. Left breast prosthesis. No blunting of costophrenic angles. No pneumothorax. Mediastinal contours appear intact.  IMPRESSION: Emphysematous changes in the lungs. No evidence of active pulmonary disease.   Electronically Signed   By: Lucienne Capers M.D.   On: 06/16/2014 04:07   Ct Angio Chest Pe W/cm &/or Wo Cm  06/16/2014   CLINICAL DATA:  Shortness of breath and mid chest pain. Cough. Symptoms beginning on Sunday. History of pneumonia and breast cancer.  EXAM: CT ANGIOGRAPHY CHEST WITH CONTRAST  TECHNIQUE:  Multidetector CT imaging of the chest was performed using the standard protocol during bolus administration of intravenous contrast. Multiplanar CT image reconstructions and MIPs were obtained to evaluate the vascular anatomy.  CONTRAST:  42mL OMNIPAQUE IOHEXOL 350 MG/ML SOLN  COMPARISON:  08/21/2012  FINDINGS: Technically adequate study with good opacification of the central and segmental pulmonary arteries. No focal filling defects demonstrated. No evidence of significant pulmonary embolus. Filling defect in the superior vena cava likely represent cm flow of on opacified blood.  Normal heart size. Normal caliber thoracic aorta. No aortic dissection. Great vessel origins are patent. Calcification in the aorta. Esophagus is decompressed. No significant lymphadenopathy in the chest. Surgical clips in the left axilla. Postoperative bilateral mastectomies with left breast reconstruction prosthesis.  Calcification and pleural thickening in the apices suggesting postinflammatory change. Diffuse emphysematous changes in the lungs. No focal airspace disease or consolidation. Airways are patent. No pleural effusions. No pneumothorax.  Included portions of the upper abdominal organs are grossly unremarkable except for vascular calcifications. Degenerative changes in the thoracic spine. No destructive bone lesions appreciated.  Review of  the MIP images confirms the above findings.  IMPRESSION: No evidence of significant pulmonary embolus. With emphysematous changes in the lungs with apical pleural thickening and calcifications suggesting postinflammatory change. No evidence of active pulmonary disease.   Electronically Signed   By: Lucienne Capers M.D.   On: 06/16/2014 05:21   Echo Left ventricle: The cavity size was normal. Wall thickness was normal. Systolic function was normal. The estimated ejection fraction was in the range of 60% to 65%. Although no diagnostic regional wall motion abnormality was  identified, this possibility cannot be completely excluded on the basis of this study. Doppler parameters are consistent with abnormal left ventricular relaxation (grade 1 diastolic dysfunction). Doppler parameters are consistent with high ventricular filling pressure. - Right ventricle: The cavity size was mildly dilated. Wall thickness was normal  Microbiology: No results found for this or any previous visit (from the past 240 hour(s)).   Labs: Basic Metabolic Panel:  Recent Labs Lab 06/16/14 0437 06/16/14 0718 06/17/14 0349  NA 137 137 139  K 3.3* 3.6 3.7  CL 100 105 109  CO2  --  19 25  GLUCOSE 116* 146* 94  BUN 10 8 <5*  CREATININE 0.70 0.89 0.65  CALCIUM  --  7.8* 7.4*   Liver Function Tests:  Recent Labs Lab 06/16/14 0718  AST 27  ALT 10  ALKPHOS 67  BILITOT 0.3  PROT 6.4  ALBUMIN 3.1*   No results for input(s): LIPASE, AMYLASE in the last 168 hours. No results for input(s): AMMONIA in the last 168 hours. CBC:  Recent Labs Lab 06/16/14 0430 06/16/14 0437 06/16/14 0718 06/17/14 0349  WBC 7.9  --  9.9 6.7  NEUTROABS 5.5  --  7.7  --   HGB 14.2 15.6* 13.8 12.4  HCT 42.8 46.0 41.5 38.7  MCV 90.7  --  92.6 91.5  PLT 208  --  231 217   Cardiac Enzymes:  Recent Labs Lab 06/16/14 0718 06/16/14 1327 06/16/14 1909  TROPONINI <0.03 0.06* 0.05*   BNP: BNP (last 3 results) No results for input(s): PROBNP in the last 8760 hours. CBG: No results for input(s): GLUCAP in the last 168 hours.     SignedDelfina Redwood  [facility] 06/18/2014, 12:49 PM

## 2014-06-18 NOTE — Care Management Note (Signed)
    Page 1 of 2   06/18/2014     2:07:15 PM CARE MANAGEMENT NOTE 06/18/2014  Patient:  Rhonda Bryant, Rhonda Bryant   Account Number:  [id number]  Date Initiated:  06/18/2014  Documentation initiated by:  Marvetta Gibbons  Subjective/Objective Assessment:   Pt admitted with SOB/bronchitis     Action/Plan:   PTA pt lived at home with spouse   Anticipated DC Date:  06/18/2014   Anticipated DC Plan:  [place]  In-house referral  Clinical Social Worker      DC Forensic scientist  CM consult      [facility] Choice  [place]   Choice offered to / List presented to:  C-1 Patient   DME arranged  [place]      DME agency  [place] arranged  HH-1 RN  [place]      [place]   Status of service:  Completed, signed off Medicare Important Message given?  NA - LOS <3 / Initial given by admissions (If response is "NO", the following Medicare IM given date fields will be blank) Date Medicare IM given:   Medicare IM given by:   Date Additional Medicare IM given:   Additional Medicare IM given by:    Discharge Disposition:  [place]  Per UR Regulation:  Reviewed for med. necessity/level of care/duration of stay  If discussed at [place] of Stay Meetings, dates discussed:    Comments:  06/18/14- 36- Marvetta Gibbons RN, BSN [phone] Pt for d/c home today, recommendations for [facility]- in to speak with pt at bedside- per conversation pt states that he husband has [place] with [facility]- and she also would like to use [place] for [facility], [facility], [facility] (PT worker that has been seeing spouse- is Dorann Ou) she understands that [facility] is not a daily services and that they do not come on call. Pt also has been given info on meals on wheels by CSW- along with other resources. Pt states that she does not have a RW but would like a rollator for home- she wants to  wait on shower chair to be assessed by the [facility] PT as her shower is small- explained that rollator would be brought to room prior to her leaving by [facility]- she also has order for home 02 but would like that delivered to her home as she is worried about the weight of the tank and getting it home- she is currently on RA- call made to [facility] with [facility] for DME needs- Referral for [facility] needs called to [facility] with [facility] for HH-RN/PT/OT/aide and CSW. (of note pt did not wait for rollator - have asked [facility] to deliver that to home with 02)

## 2014-07-23 ENCOUNTER — Telehealth: Payer: Self-pay | Admitting: Oncology

## 2014-07-23 ENCOUNTER — Ambulatory Visit (HOSPITAL_BASED_OUTPATIENT_CLINIC_OR_DEPARTMENT_OTHER): Payer: Medicare Other | Admitting: Oncology

## 2014-07-23 VITALS — BP 168/63 | HR 90 | Temp 97.0°F | Resp 18 | Ht 64.0 in | Wt 107.2 lb

## 2014-07-23 DIAGNOSIS — M810 Age-related osteoporosis without current pathological fracture: Secondary | ICD-10-CM

## 2014-07-23 DIAGNOSIS — Z1509 Genetic susceptibility to other malignant neoplasm: Secondary | ICD-10-CM

## 2014-07-23 DIAGNOSIS — Z170 Estrogen receptor positive status [ER+]: Secondary | ICD-10-CM

## 2014-07-23 DIAGNOSIS — C50211 Malignant neoplasm of upper-inner quadrant of right female breast: Secondary | ICD-10-CM

## 2014-07-23 DIAGNOSIS — Z1501 Genetic susceptibility to malignant neoplasm of breast: Secondary | ICD-10-CM

## 2014-07-23 NOTE — Telephone Encounter (Signed)
, °

## 2014-07-23 NOTE — Progress Notes (Signed)
ID: [name] OB: Apr 17, 1925  MR#: [id number]  TDV#:[id number]  PCP: [name], MD GYN:   SU: Excell Seltzer OTHER MD: Crissie Reese  CHIEF COMPLAINT: Estrogen receptor positive breast cancer in woman with BRCA2 mutation  CURRENT TREATMENT: Anastrozole, denosumab   BREAST CANER HISTORY: From the original intake note:  Patch herself noted a change in her right breast, and eventually brought it to the attention of Dr. Brigitte Pulse, who set her up for right mammography and ultrasonography at the breast center 03/02/2013. Implant displaced views could not be obtained and no definite mass could be identified on the limited mammographic figures. However on physical exam there was a firm mass palpable at the 2:00 position of the right breast ultrasound showed this to measure 2.3 cm to be lobular and hypoechoic. There was no right axillary lymph adenopathy identified.  Biopsy of this mass 03/05/2013 showed ([id number]) and invasive ductal carcinoma, grade 1 or 2, estrogen receptor 100% positive with strong staining intensity, progesterone receptor and HER-2 negative with an MIB-1 of 20%.  On 04/03/2013 the patient underwent right total mastectomy and sentinel lymph node biopsy with total capsulotomy and removal of all implant material. The pathology from this procedure ([id number]) confirmed an invasive ductal carcinoma, grade 2, measuring 1.9 cm, with ample margins. Both sentinel lymph nodes were clear. Repeat HER-2 testing was again negative.  The patient's subsequent history is as detailed below  INTERVAL HISTORY: Rhonda Bryant returns today for follow up of her breast cancer. The interval history is significant for her having undergone bilateral salpingo-oophorectomy under Dr. Skeet Latch 02/23/2014. The pathology from this procedure ([id number]) showed no evidence of malignancy. The patient has been on anastrozole and denosumab with no side effects that she is aware of.  More recently however she  has developed persistent nausea and vomiting. Dr. Brigitte Pulse has been evaluating this and has simplified the patient's medications including holding the anastrozole and denosumab for now. What seems to have made a difference to Rhonda Bryant is the start of benzodiazepines. Her nausea is currently well-controlled. She wonders if she should be going back on the anastrozole and denosumab at this point  REVIEW OF SYSTEMS: Quite aside from her own problems, Rhonda Bryant has been dealing with her husband's congestive heart failure. He is due for pacemaker insertion. She herself complains of night sweats, fatigue, double vision which is currently corrected with a prism on the right, ringing in her ears, minimal epistaxis, gum disease, shortness of breath, heartburn, back and joint pain which is not more persistent or intense than usual, occasional headaches, and anxiety but not depression. A detailed review of systems today was otherwise stable   PAST MEDICAL HISTORY: Past Medical History  Diagnosis Date  . Chronic airway obstruction, not elsewhere classified   . Allergic rhinitis, cause unspecified   . Osteoporosis   . Blood transfusion without reported diagnosis     3 units with 3rd child  . Cancer   . PONV (postoperative nausea and vomiting)   . Depression   . Anxiety   . Shortness of breath     only with lots of activity  . Malignant neoplasm of breast (female), unspecified site     bilateral breast cancer ages 28 and 34  . Family history of malignant neoplasm of breast   . BRCA2 positive     p.[id number]* (c.7558C>T) also known as 7786C>T  . COPD (chronic obstructive pulmonary disease)   . Asthma   . Pneumonia     had twice  .  Heart murmur     slight  . Unspecified essential hypertension   . Swelling of ankle     bilateral  . Headache(784.0)   . Arthritis     neck  . Double vision     right eye  . Bronchiolitis, acute 06/2014    PAST SURGICAL HISTORY: Past Surgical History  Procedure Laterality Date  .  Cataract extraction Bilateral   . Tonsillectomy    . Colonoscopy    . Simple mastectomy with axillary sentinel node biopsy Right 04/03/2013    Procedure: TOTAL MASTECTOMY WITH AXILLARY SENTINEL NODE BIOPSY;  Surgeon: Edward Jolly, MD;  Location: [place];  Service: General;  Laterality: Right;  . Capsulectomy Right 04/03/2013    Procedure: TOTAL CAPSULECTOMY/REMOVAL OF IMPLANT MATERIAL RIGHT BREAST;  Surgeon: Crissie Reese, MD;  Location: [place];  Service: Plastics;  Laterality: Right;  . Mastectomy  1989    left-with lymph nodes  . Tonsillectomy  as child  . Skin cancer destruction      x2 on nose, x1 on neck  . Robotic assisted bilateral salpingo oopherectomy Bilateral 02/23/2014    Procedure: ROBOTIC ASSISTED BILATERAL SALPINGO OOPHORECTOMY;  Surgeon: Janie Morning, MD;  Location: [facility];  Service: Gynecology;  Laterality: Bilateral;    FAMILY HISTORY Family History  Problem Relation Age of Onset  . Pancreatic cancer Father     deceased 30  . Breast cancer Mother 45    deceased 17   the patient's father died at the age of 13. He had pancreatic cancer. The patient's mother died at the age of 93. She had been diagnosed with breast cancer in her late 41s. The patient has one brother, 2 sisters. There is no history of ovarian cancer in the family except possibly for the patient's maternal grandmother, who died with what seems to have been some cancer involving the abdomen.  GYNECOLOGIC HISTORY:  Menarche age 14, first live birth age 66, the patient is [place] P3. She went through the change of life in her late 39s. She did not take hormone replacement.  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 Her husband H. "[place] Destaney Bryant. used to work in Personal assistant, now works 2 hours a week at [employer] The patient's son Rhonda Bryant was a marine and took his own life in the setting of what sounds like severe posttraumatic stress syndrome.  Son Rhonda Bryant took his own life October of 2013. The patient is still grieving those deaths. The patient's daughter Rhonda Bryant lives in [place], [place]. The patient has 2 grandchildren and 2 great-grandchildren. She at tends [place]: not on file   HEALTH MAINTENANCE: History  Substance Use Topics  . Smoking status: Former Smoker -- 4 years    Types: Cigarettes    Quit date: 06/11/1957  . Smokeless tobacco: Never Used  . Alcohol Use: Yes     Comment: rarely     Colonoscopy:   PAP:  Bone density: Osteoporosis, per patient report  Lipid panel:  Allergies  Allergen Reactions  . Codeine Nausea Only    REACTION: nausea  . Nsaids     Pt states to avoid this med causes nausea.  . Other     Ragweed and Dust  . Sulfamethoxazole-Trimethoprim Nausea Only    REACTION: nausea    Current Outpatient Prescriptions  Medication Sig Dispense Refill  . albuterol (PROVENTIL HFA;VENTOLIN HFA) 108 (90 BASE)  MCG/ACT inhaler Inhale 2 puffs into the lungs every 6 (six) hours as needed for wheezing or shortness of breath.    . Aloe-Sodium Chloride (AYR SALINE NASAL GEL NA) Place 1 application into the nose as needed (nasal driness.or congestion).    . ALPRAZolam (XANAX) 0.25 MG tablet Take 0.25 mg by mouth 2 (two) times daily as needed for sleep or anxiety.     Marland Kitchen amLODipine (NORVASC) 10 MG tablet Take 5 mg by mouth every morning.     Marland Kitchen anastrozole (ARIMIDEX) 1 MG tablet Take 1 mg by mouth daily.    . calcium carbonate (TUMS EX) 750 MG chewable tablet Chew 1 tablet by mouth 2 (two) times daily as needed for heartburn.    . Calcium-Vitamin D 600-125 MG-UNIT TABS Take 1 tablet by mouth 2 (two) times daily.      Marland Kitchen denosumab (PROLIA) 60 MG/ML SOLN injection Inject 60 mg into the skin every 6 (six) months. Administer in upper arm, thigh, or abdomen    . Fluticasone-Salmeterol (ADVAIR) 100-50 MCG/DOSE AEPB Inhale 1 puff into the lungs at bedtime.     Marland Kitchen  levofloxacin (LEVAQUIN) 500 MG tablet Take 1 tablet (500 mg total) by mouth daily. 3 tablet 0  . loratadine (CLARITIN) 10 MG tablet Take 10 mg by mouth every morning.     . miconazole (MICOTIN) 2 % cream Apply 1 application topically 2 (two) times daily as needed (vulva itching.).    Marland Kitchen mometasone (NASONEX) 50 MCG/ACT nasal spray Place 2 sprays into the nose at bedtime.    . Multiple Vitamin (MULTIVITAMIN) capsule Take 1 capsule by mouth every morning.     Marland Kitchen MYRBETRIQ 25 MG TB24 Take 25 mg by mouth every morning.     Marland Kitchen omeprazole (PRILOSEC OTC) 20 MG tablet Take 20 mg by mouth every morning.     No current facility-administered medications for this visit.    OBJECTIVE: Older white woman in no acute distress Filed Vitals:   07/23/14 1044  BP: 168/63  Pulse: 90  Temp: 97 F (36.1 C)  Resp: 18     Body mass index is 18.39 kg/(m^2).      ECOG FS:1 - Symptomatic but completely ambulatory  Sclerae unicteric, right-sided prism for ductal diplopia correction Oropharynx clear and moist No cervical or supraclavicular adenopathy Lungs no rales or rhonchi Heart regular rate and rhythm Abd soft, nontender, positive bowel sounds MSK kyphosis and scoliosis but no focal spinal tenderness, no upper extremity lymphedema Neuro: nonfocal, well oriented, appropriate affect Breasts: Status post bilateral mastectomies. There is no evidence of chest wall recurrence. Both axillae are benign.  LAB RESULTS:  CMP     Component Value Date/Time   NA 139 06/17/2014 0349   NA 142 02/11/2014 1058   K 3.7 06/17/2014 0349   K 4.1 02/11/2014 1058   CL 109 06/17/2014 0349   CO2 25 06/17/2014 0349   CO2 27 02/11/2014 1058   GLUCOSE 94 06/17/2014 0349   GLUCOSE 73 02/11/2014 1058   BUN <5* 06/17/2014 0349   BUN 12.0 02/11/2014 1058   CREATININE 0.65 06/17/2014 0349   CREATININE 0.8 02/11/2014 1058   CALCIUM 7.4* 06/17/2014 0349   CALCIUM 9.2 02/11/2014 1058   PROT 6.4 06/16/2014 0718   PROT 7.4  02/11/2014 1058   ALBUMIN 3.1* 06/16/2014 0718   ALBUMIN 3.7 02/11/2014 1058   AST 27 06/16/2014 0718   AST 16 02/11/2014 1058   ALT 10 06/16/2014 0718   ALT 11 02/11/2014 1058  ALKPHOS 67 06/16/2014 0718   ALKPHOS 68 02/11/2014 1058   BILITOT 0.3 06/16/2014 0718   BILITOT 0.35 02/11/2014 1058   GFRNONAA 78* 06/17/2014 0349   GFRAA 90* 06/17/2014 0349    I No results found for: SPEP  Lab Results  Component Value Date   WBC 6.7 06/17/2014   NEUTROABS 7.7 06/16/2014   HGB 12.4 06/17/2014   HCT 38.7 06/17/2014   MCV 91.5 06/17/2014   PLT 217 06/17/2014      Chemistry      Component Value Date/Time   NA 139 06/17/2014 0349   NA 142 02/11/2014 1058   K 3.7 06/17/2014 0349   K 4.1 02/11/2014 1058   CL 109 06/17/2014 0349   CO2 25 06/17/2014 0349   CO2 27 02/11/2014 1058   BUN <5* 06/17/2014 0349   BUN 12.0 02/11/2014 1058   CREATININE 0.65 06/17/2014 0349   CREATININE 0.8 02/11/2014 1058      Component Value Date/Time   CALCIUM 7.4* 06/17/2014 0349   CALCIUM 9.2 02/11/2014 1058   ALKPHOS 67 06/16/2014 0718   ALKPHOS 68 02/11/2014 1058   AST 27 06/16/2014 0718   AST 16 02/11/2014 1058   ALT 10 06/16/2014 0718   ALT 11 02/11/2014 1058   BILITOT 0.3 06/16/2014 0718   BILITOT 0.35 02/11/2014 1058       No results found for: LABCA2  No components found for: LABCA125  No results for input(s): INR in the last 168 hours.  Urinalysis    Component Value Date/Time   COLORURINE YELLOW 02/19/2014 1045   APPEARANCEUR CLEAR 02/19/2014 1045   LABSPEC 1.007 02/19/2014 1045   PHURINE 7.5 02/19/2014 1045   GLUCOSEU NEGATIVE 02/19/2014 1045   HGBUR NEGATIVE 02/19/2014 [place] 02/19/2014 1045   KETONESUR NEGATIVE 02/19/2014 1045   PROTEINUR NEGATIVE 02/19/2014 1045   UROBILINOGEN 0.2 02/19/2014 1045   NITRITE NEGATIVE 02/19/2014 1045   LEUKOCYTESUR NEGATIVE 02/19/2014 1045    STUDIES: CLINICAL DATA: Shortness of breath and mid chest pain.  Cough. Symptoms beginning on Sunday. History of pneumonia and breast cancer.  EXAM: CT ANGIOGRAPHY CHEST WITH CONTRAST  TECHNIQUE: Multidetector CT imaging of the chest was performed using the standard protocol during bolus administration of intravenous contrast. Multiplanar CT image reconstructions and MIPs were obtained to evaluate the vascular anatomy.  CONTRAST: 67mL OMNIPAQUE IOHEXOL 350 MG/ML SOLN  COMPARISON: 08/21/2012  FINDINGS: Technically adequate study with good opacification of the central and segmental pulmonary arteries. No focal filling defects demonstrated. No evidence of significant pulmonary embolus. Filling defect in the superior vena cava likely represent cm flow of on opacified blood.  Normal heart size. Normal caliber thoracic aorta. No aortic dissection. Great vessel origins are patent. Calcification in the aorta. Esophagus is decompressed. No significant lymphadenopathy in the chest. Surgical clips in the left axilla. Postoperative bilateral mastectomies with left breast reconstruction prosthesis.  Calcification and pleural thickening in the apices suggesting postinflammatory change. Diffuse emphysematous changes in the lungs. No focal airspace disease or consolidation. Airways are patent. No pleural effusions. No pneumothorax.  Included portions of the upper abdominal organs are grossly unremarkable except for vascular calcifications. Degenerative changes in the thoracic spine. No destructive bone lesions appreciated.  Review of the MIP images confirms the above findings.  IMPRESSION: No evidence of significant pulmonary embolus. With emphysematous changes in the lungs with apical pleural thickening and calcifications suggesting postinflammatory change. No evidence of active pulmonary disease.   Electronically Signed  By: Lucienne Capers  M.D.  On: 06/16/2014 05:21   ASSESSMENT: 81 y.o. [place] woman  (1) status  post left modified radical mastectomy in 1989; receive tamoxifen adjuvantly for 10 years.  (2) status post right mastectomy and sentinel lymph node sampling 04/03/2013 for a pT2 pN0, stage IIA invasive ductal carcinoma, grade 2, estrogen receptor 100% positive, progesterone receptor negative, with an MIB-1 of 20%, and no HER-2 amplification.  (3) anastrozole started 05/22/2013  (4) denosumab to start 08/17/2013, to be repeated Q 6 months while on anastrozole  (5) BRCA 2 positivity, s/p bilateral salpingo oophorectomy by Dr. Skeet Latch on 02/23/14 with benign pathology  PLAN: Rhonda Bryant is doing well from a breast cancer point of view, with no evidence of disease recurrence or activity. I am not sure what the source of her nausea was, and Dr. Brigitte Pulse has been simplifying the patient's medications as much as possible in an attempt to clear that problem. Rhonda Bryant seems to be doing much better since benzodiazepines were added. I do not believe either denosumab or anastrozole are related to the nausea, and I would favor resuming the anastrozole after the patient meets with Dr. Brigitte Pulse next week. We will consider resuming the denosumab in March assuming the patient continues to do well overall  Given the intercurrent problems Rhonda Bryant and her husband have been having, she is beginning to consider "downsizing" to a retirement community and is beginning to explore that option.  She will see me again in 3 months. She knows to call for any problems that may develop before that visit.  Chauncey Cruel, [place] [phone] 07/23/2014 11:14 AM

## 2014-07-26 NOTE — Addendum Note (Signed)
Addended by: Jaci Carrel A on: 07/26/2014 08:42 AM   Modules accepted: Orders, Medications

## 2014-08-12 ENCOUNTER — Other Ambulatory Visit: Payer: Self-pay | Admitting: Pharmacist

## 2014-08-12 DIAGNOSIS — C50211 Malignant neoplasm of upper-inner quadrant of right female breast: Secondary | ICD-10-CM

## 2014-08-13 ENCOUNTER — Other Ambulatory Visit: Payer: Self-pay | Admitting: Oncology

## 2014-08-13 ENCOUNTER — Ambulatory Visit: Payer: Medicare Other

## 2014-08-13 ENCOUNTER — Other Ambulatory Visit: Payer: Medicare Other

## 2014-09-01 ENCOUNTER — Other Ambulatory Visit: Payer: Self-pay | Admitting: Nurse Practitioner

## 2014-09-06 ENCOUNTER — Ambulatory Visit: Payer: Medicare Other | Admitting: Internal Medicine

## 2014-09-06 ENCOUNTER — Encounter: Payer: Self-pay | Admitting: Internal Medicine

## 2014-09-06 VITALS — BP 158/68 | HR 82 | Ht 64.0 in | Wt 110.8 lb

## 2014-09-06 DIAGNOSIS — J45998 Other asthma: Secondary | ICD-10-CM

## 2014-09-06 NOTE — Progress Notes (Signed)
Patient ID: Rhonda Bryant, female    DOB: May 05, 1925, 81 y.o.   MRN: [id number]  HPI 01/09/11- 89 yoF followed for COPD, Allergic rhinitis   Dr Serita Grammes Last here 05/22/2010- note reviewed [place] 7/6-12/17/10 in June with bronchopneumonia per CT 12/16/10, treated Avelox. She had felt some fever and chills, coughing scant dark sputum.   Still feels washed out and anorectic. Additional stress as she has moved from hose to apartment. Husband is helping. Denies reflux and had not been sick acutely prior to this. Had felt tired, and weight had gone down some in preceding months. Remains unusually anxious. No hx thyroid or DM Chest now feels normal on her routine meds. Still a little dry cough. Has had pneumovax more than once.   07/10/11- 61 yoF followed for COPD, Allergic rhinitis    Had flu vaccine. Used a Z-Pak while getting over a chest cold in early December but otherwise has done well with no acute respiratory illness. Today she feels "fine". Says she is doing well with allergy vaccine. Using a saline nasal gel which reduces epistaxis. Using Advair once daily and almost never needs her rescue inhaler.  01/08/12- 87 yoF followed for COPD, Allergic rhinitis  :"doing good" denies any wheezing, cough, congestion, or  SOB COPD assessment test (CAT) 6/40 She continues allergy vaccine without problems at 1:10. She expresses concern about exposure to mold or dust. 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 She denies any wheezing and admits only a little cough. CT chest 12/16/10 ( after pneumonia in June) reviewed. IMPRESSION:  No evidence of pulmonary embolism.  Patchy opacities in the right middle lobe, as well as scattered  mild opacities in the lingula and bilateral lower lobes, possibly  reflecting a very mild pneumonia or bronchiolitis.  Original Report Authenticated By: Julian Hy, M.D.   07/31/12- 49 yoF former smoker followed for COPD, Allergic rhinitis  FOLLOWS FOR: still on allergy vaccine 1:10 GH and no reactions, having sneezing episodes Viral bronchitis syndrome in January resolved. Occasional sneezing and blowing. She does have Astelin and Nasonex nasal sprays. She feels well enough controlled. We discussed chest CT from July, 2012 and agreed to update chest x-ray.  02/02/13- 88 yoF former smoker followed for COPD, Allergic rhinitis  FOLLOWS FOR: still on  Allergy vaccine 1:10 GH and doing well; has had dry cough for several months however. Dry cough since spring with throat tickle. Still using Advair one time daily. Little postnasal drip or reflux. CT 08/12/12 IMPRESSION:  1. No acute cardiopulmonary abnormalities.  2. Similar appearance of the bilateral upper lobe predominant  parenchymal scarring and pleural thickening with calcifications.  3. Stable 3 mm nodule in the left upper lobe. This is most likely  benign.  Original Report Authenticated By: Kerby Moors, M.D.  09/04/13- 77 yoF former smoker followed for COPD, Allergic rhinitis, complicated by hx breast Ca FOLLOWS FOR: Pt c/o runny nose, some sinus congestion, nonprod cough.  Pt has not been taking allergy injections regularly. Had right mastectomy last October with no radiation. Remote left mastectomy.  09/06/14- 55 yoF former smoker followed for COPD, Allergic rhinitis, complicated by hx breast Ca FOLLOW FOR:  Patient says she had a bad year.  Since last visit, she found out she had breast cancer again, had both breasts removed, ovaries removed.  In October, she  became nauseated every day until earlier this year.  She lost a lot of weight.  Developed acute bronchitis in January, hospitalized for 5 days.  Still feels weak.    CT chest 06/16/14 IMPRESSION: No evidence of significant pulmonary embolus. With emphysematous changes in the lungs with apical pleural thickening and calcifications  suggesting postinflammatory change. No evidence of active pulmonary disease. Electronically Signed  By: Lucienne Capers M.D.  On: 06/16/2014 05:21   ROS-see HPI Constitutional:   No-   weight loss, night sweats, fevers, chills, fatigue, lassitude. HEENT:   No-  headaches, difficulty swallowing, tooth/dental problems, sore throat,       No-  sneezing, itching, ear ache, nasal congestion, +post nasal drip,  CV:  No-   chest pain, orthopnea, PND, swelling in lower extremities, anasarca, dizziness, palpitations Resp: No-   shortness of breath with exertion or at rest.              No-   productive cough,  +non-productive cough,  No- coughing up of blood.              No-   change in color of mucus.  No- wheezing.   Skin: No-   rash or lesions. GI:  No-   heartburn, indigestion, abdominal pain, nausea, vomiting,  GU:  MS:  No-   joint pain or swelling.   Neuro-     nothing unusual Psych:  No- change in mood or affect. No depression or anxiety.  No memory loss.  Objective:   Physical Exam General- Alert, Oriented, Affect-appropriate, Distress- none acute,   thin Skin- rash-none, lesions- none, excoriation- none Lymphadenopathy- none Head- atraumatic            Eyes- Gross vision intact, PERRLA, strabismus, conjunctivae clear secretions            Ears- Hearing, canals normal            Nose- Clear, no-Septal dev, mucus, polyps, erosion, perforation             Throat- Mallampati II , mucosa clear , drainage- none, tonsils- atrophic Neck- flexible , trachea midline, no stridor , thyroid nl, carotid no bruit Chest - symmetrical excursion , unlabored           Heart/CV- RRR , no murmur , no gallop  , no rub, nl s1 s2                           - JVD- none , edema- none, stasis changes- none, varices- none           Lung- clear to P&A, + distant , wheeze- none, cough- none , dullness-none, rub- none           Chest wall- bilateral mastectomy Abd- Br/ Gen/ Rectal- Not done, not  indicated Extrem- cyanosis- none, clubbing, none, atrophy- none, strength- nl Neuro- grossly intact to observation

## 2014-09-06 NOTE — Patient Instructions (Signed)
We can continue present meds  Walk as much as you can to get your strength back   Please call if we can help

## 2014-10-01 ENCOUNTER — Encounter: Payer: Self-pay | Admitting: Internal Medicine

## 2014-10-15 ENCOUNTER — Other Ambulatory Visit: Payer: Medicare Other

## 2014-10-22 ENCOUNTER — Ambulatory Visit (HOSPITAL_BASED_OUTPATIENT_CLINIC_OR_DEPARTMENT_OTHER): Payer: Medicare Other | Admitting: Nurse Practitioner

## 2014-10-22 ENCOUNTER — Telehealth: Payer: Self-pay | Admitting: Nurse Practitioner

## 2014-10-22 ENCOUNTER — Ambulatory Visit (HOSPITAL_BASED_OUTPATIENT_CLINIC_OR_DEPARTMENT_OTHER): Payer: Medicare Other

## 2014-10-22 ENCOUNTER — Encounter: Payer: Self-pay | Admitting: Nurse Practitioner

## 2014-10-22 ENCOUNTER — Telehealth: Payer: Self-pay | Admitting: *Deleted

## 2014-10-22 VITALS — BP 187/67 | HR 91 | Temp 98.0°F | Resp 18 | Ht 64.0 in | Wt 113.7 lb

## 2014-10-22 DIAGNOSIS — M810 Age-related osteoporosis without current pathological fracture: Secondary | ICD-10-CM

## 2014-10-22 DIAGNOSIS — Z170 Estrogen receptor positive status [ER+]: Secondary | ICD-10-CM

## 2014-10-22 DIAGNOSIS — C50211 Malignant neoplasm of upper-inner quadrant of right female breast: Secondary | ICD-10-CM

## 2014-10-22 LAB — CBC WITH DIFFERENTIAL/PLATELET
BASO%: 0.4 % (ref 0.0–2.0)
BASOS ABS: 0 10*3/uL (ref 0.0–0.1)
EOS%: 2 % (ref 0.0–7.0)
Eosinophils Absolute: 0.2 10*3/uL (ref 0.0–0.5)
HCT: 41.9 % (ref 34.8–46.6)
HGB: 14.2 g/dL (ref 11.6–15.9)
LYMPH#: 2.4 10*3/uL (ref 0.9–3.3)
LYMPH%: 32.1 % (ref 14.0–49.7)
MCH: 31.1 pg (ref 25.1–34.0)
MCHC: 33.9 g/dL (ref 31.5–36.0)
MCV: 91.9 fL (ref 79.5–101.0)
MONO#: 0.6 10*3/uL (ref 0.1–0.9)
MONO%: 8 % (ref 0.0–14.0)
NEUT%: 57.5 % (ref 38.4–76.8)
NEUTROS ABS: 4.2 10*3/uL (ref 1.5–6.5)
Platelets: 234 10*3/uL (ref 145–400)
RBC: 4.56 10*6/uL (ref 3.70–5.45)
RDW: 12.9 % (ref 11.2–14.5)
WBC: 7.4 10*3/uL (ref 3.9–10.3)

## 2014-10-22 LAB — COMPREHENSIVE METABOLIC PANEL (CC13)
ALBUMIN: 4.1 g/dL (ref 3.5–5.0)
ALT: 11 U/L (ref 0–55)
AST: 17 U/L (ref 5–34)
Alkaline Phosphatase: 63 U/L (ref 40–150)
Anion Gap: 10 mEq/L (ref 3–11)
BILIRUBIN TOTAL: 0.63 mg/dL (ref 0.20–1.20)
BUN: 14.1 mg/dL (ref 7.0–26.0)
CO2: 25 meq/L (ref 22–29)
Calcium: 9.2 mg/dL (ref 8.4–10.4)
Chloride: 105 mEq/L (ref 98–109)
Creatinine: 0.9 mg/dL (ref 0.6–1.1)
EGFR: 56 mL/min/{1.73_m2} — ABNORMAL LOW (ref >90)
Glucose: 127 mg/dl (ref 70–140)
Potassium: 3.6 mEq/L (ref 3.5–5.1)
Sodium: 140 mEq/L (ref 136–145)
TOTAL PROTEIN: 7.8 g/dL (ref 6.4–8.3)

## 2014-10-22 MED ORDER — DENOSUMAB 60 MG/ML ~~LOC~~ SOLN
60.0000 mg | Freq: Once | SUBCUTANEOUS | Status: AC
  Administered 2014-10-22: 60 mg via SUBCUTANEOUS
  Filled 2014-10-22: qty 1

## 2014-10-22 NOTE — Telephone Encounter (Signed)
Patient has arrived and safe with daughter.  Obtained new address.  She is moving with Family in [place].  [employer].  No phone set up yet so we'll need to contact her daughter Santiago Glad if needed.

## 2014-10-22 NOTE — Telephone Encounter (Signed)
Gave avs. Sent patient to lab.

## 2014-10-22 NOTE — Progress Notes (Signed)
ID: Rhonda Bryant OB: November 26, 1924  MR#: [id number]  KNL#:[id number]  PCP: Rhonda Neer, MD GYN:   SU: Rhonda Bryant OTHER MD: Rhonda Bryant  CHIEF COMPLAINT: Estrogen receptor positive breast cancer in woman with BRCA2 mutation  CURRENT TREATMENT: Anastrozole, denosumab  BREAST CANER HISTORY: From the original intake note:  Patch herself noted a change in her right breast, and eventually brought it to the attention of Dr. Brigitte Bryant, who set her up for right mammography and ultrasonography at the breast center 03/02/2013. Implant displaced views could not be obtained and no definite mass could be identified on the limited mammographic figures. However on physical exam there was a firm mass palpable at the 2:00 position of the right breast ultrasound showed this to measure 2.3 cm to be lobular and hypoechoic. There was no right axillary lymph adenopathy identified.  Biopsy of this mass 03/05/2013 showed ([id number]) and invasive ductal carcinoma, grade 1 or 2, estrogen receptor 100% positive with strong staining intensity, progesterone receptor and HER-2 negative with an MIB-1 of 20%.  On 04/03/2013 the patient underwent right total mastectomy and sentinel lymph node biopsy with total capsulotomy and removal of all implant material. The pathology from this procedure ([id number]) confirmed an invasive ductal carcinoma, grade 2, measuring 1.9 cm, with ample margins. Both sentinel lymph nodes were clear. Repeat HER-2 testing was again negative.  The patient's subsequent history is as detailed below  INTERVAL HISTORY: Rhonda Bryant returns today for follow up of her breast cancer. She has been on anastrozole since December 2014 and is tolerating this drug well with no complaints that she is aware of. She denies hot flashes, vaginal changes, or increased arthralgias/myalgias. She has some chronic back and joint pain which are mild. She is also on denosumab every 6 months, but is a few months behind on her  current dose. She is packing up her house and is planning to move up to [place] to be closer to her daughter as both her and her husband are growing older.  REVIEW OF SYSTEMS: Rhonda Bryant denies fevers, chills, nausea, or vomiting. She has a history of heartburn. She has shortness of breath on occasion and a dry cough. She is fatigued most days. She errs on the side of constipation and takes miralax daily. She has occasional headaches and double vision, corrected with a special lens on the right eye. A detailed review of systems is otherwise stable.  PAST MEDICAL HISTORY: Past Medical History  Diagnosis Date  . Chronic airway obstruction, not elsewhere classified   . Allergic rhinitis, cause unspecified   . Osteoporosis   . Blood transfusion without reported diagnosis     3 units with 3rd child  . Cancer   . PONV (postoperative nausea and vomiting)   . Depression   . Anxiety   . Shortness of breath     only with lots of activity  . Malignant neoplasm of breast (female), unspecified site     bilateral breast cancer ages 77 and 68  . Family history of malignant neoplasm of breast   . BRCA2 positive     p.[id number]* (c.7558C>T) also known as 7786C>T  . COPD (chronic obstructive pulmonary disease)   . Asthma   . Pneumonia     had twice  . Heart murmur     slight  . Unspecified essential hypertension   . Swelling of ankle     bilateral  . Headache(784.0)   . Arthritis     neck  . Double vision  right eye  . Bronchiolitis, acute 06/2014    PAST SURGICAL HISTORY: Past Surgical History  Procedure Laterality Date  . Cataract extraction Bilateral   . Tonsillectomy    . Colonoscopy    . Simple mastectomy with axillary sentinel node biopsy Right 04/03/2013    Procedure: TOTAL MASTECTOMY WITH AXILLARY SENTINEL NODE BIOPSY;  Surgeon: Rhonda Jolly, MD;  Location: [facility];  Service: General;  Laterality: Right;  . Capsulectomy Right 04/03/2013    Procedure: TOTAL CAPSULECTOMY/REMOVAL  OF IMPLANT MATERIAL RIGHT BREAST;  Surgeon: Rhonda Reese, MD;  Location: [facility];  Service: Plastics;  Laterality: Right;  . Mastectomy  1989    left-with lymph nodes  . Tonsillectomy  as child  . Skin cancer destruction      x2 on nose, x1 on neck  . Robotic assisted bilateral salpingo oopherectomy Bilateral 02/23/2014    Procedure: ROBOTIC ASSISTED BILATERAL SALPINGO OOPHORECTOMY;  Surgeon: Rhonda Morning, MD;  Location: [facility];  Service: Gynecology;  Laterality: Bilateral;    FAMILY HISTORY Family History  Problem Relation Age of Onset  . Pancreatic cancer Father     deceased 1  . Breast cancer Mother 44    deceased 32   the patient's father died at the age of 4. He had pancreatic cancer. The patient's mother died at the age of 66. She had been diagnosed with breast cancer in her late 43s. The patient has one brother, 2 sisters. There is no history of ovarian cancer in the family except possibly for the patient's maternal grandmother, who died with what seems to have been some cancer involving the abdomen.  GYNECOLOGIC HISTORY:  Menarche age 61, first live birth age 9, the patient is Rhonda Bryant P3. She went through the change of life in her late 3s. She did not take hormone replacement.  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 Her husband Rhonda Bryant. used to work in Personal assistant, now works 2 hours a week at [employer] The patient's son Rhonda Bryant was a marine and took his own life in the setting of what sounds like severe posttraumatic stress syndrome. Son Rhonda Bryant took his own life October of 2013. The patient is still grieving those deaths. The patient's daughter Rhonda Bryant lives in [place], [place]. The patient has 2 grandchildren and 2 great-grandchildren. She at tends [place]: not on file   HEALTH MAINTENANCE: History  Substance Use Topics  . Smoking status: Former  Smoker -- 4 years    Types: Cigarettes    Quit date: 06/11/1957  . Smokeless tobacco: Never Used  . Alcohol Use: Yes     Comment: rarely     Colonoscopy:   PAP:  Bone density: Osteoporosis, per patient report  Lipid panel:  Allergies  Allergen Reactions  . Codeine Nausea Only    REACTION: nausea  . Nsaids     Pt states to avoid this med causes nausea.  . Other     Ragweed and Dust  . Sulfamethoxazole-Trimethoprim Nausea Only    REACTION: nausea    Current Outpatient Prescriptions  Medication Sig Dispense Refill  . albuterol (PROVENTIL HFA;VENTOLIN HFA) 108 (90 BASE) MCG/ACT inhaler Inhale 2 puffs into the lungs every 6 (six) hours as needed for wheezing or shortness of breath.    . Aloe-Sodium Chloride (AYR SALINE NASAL GEL NA) Place 1 application into the nose as needed (nasal driness.or congestion).    Marland Kitchen  ALPRAZolam (XANAX) 0.25 MG tablet Take 0.5 mg by mouth 2 (two) times daily as needed for sleep or anxiety.     Marland Kitchen amLODipine (NORVASC) 10 MG tablet Take 5 mg by mouth every Bryant.     Marland Kitchen anastrozole (ARIMIDEX) 1 MG tablet Take 1 mg by mouth daily.    . Fluticasone-Salmeterol (ADVAIR) 100-50 MCG/DOSE AEPB Inhale 1 puff into the lungs at bedtime.     Marland Kitchen loratadine (CLARITIN) 10 MG tablet Take 10 mg by mouth every Bryant.     . mometasone (NASONEX) 50 MCG/ACT nasal spray Place 2 sprays into the nose at bedtime.    Marland Kitchen omeprazole (PRILOSEC OTC) 20 MG tablet Take 20 mg by mouth every Bryant.    . calcium carbonate (TUMS EX) 750 MG chewable tablet Chew 1 tablet by mouth 2 (two) times daily as needed for heartburn.    . Calcium-Vitamin D 600-125 MG-UNIT TABS Take 1 tablet by mouth 2 (two) times daily.      Marland Kitchen denosumab (PROLIA) 60 MG/ML SOLN injection Inject 60 mg into the skin every 6 (six) months. Administer in upper arm, thigh, or abdomen     No current facility-administered medications for this visit.    OBJECTIVE: Older white woman in no acute distress Filed Vitals:    10/22/14 1151  BP: 187/67  Bryant: 91  Temp: 98 F (36.7 C)  Resp: 18     Body mass index is 19.51 kg/(m^2).      ECOG FS:1 - Symptomatic but completely ambulatory  Sclerae unicteric, right-sided prism for ductal diplopia correction Oropharynx clear and moist No cervical or supraclavicular adenopathy Lungs no rales or rhonchi Heart regular rate and rhythm Abd soft, nontender, positive bowel sounds MSK kyphosis and scoliosis but no focal spinal tenderness, no upper extremity lymphedema Neuro: nonfocal, well oriented, appropriate affect Breasts: Status post bilateral mastectomies. There is no evidence of chest wall recurrence. Both axillae are benign.  LAB RESULTS:  CMP     Component Value Date/Time   NA 139 06/17/2014 0349   NA 142 02/11/2014 1058   K 3.7 06/17/2014 0349   K 4.1 02/11/2014 1058   CL 109 06/17/2014 0349   CO2 25 06/17/2014 0349   CO2 27 02/11/2014 1058   GLUCOSE 94 06/17/2014 0349   GLUCOSE 73 02/11/2014 1058   BUN <5* 06/17/2014 0349   BUN 12.0 02/11/2014 1058   CREATININE 0.65 06/17/2014 0349   CREATININE 0.8 02/11/2014 1058   CALCIUM 7.4* 06/17/2014 0349   CALCIUM 9.2 02/11/2014 1058   PROT 6.4 06/16/2014 0718   PROT 7.4 02/11/2014 1058   ALBUMIN 3.1* 06/16/2014 0718   ALBUMIN 3.7 02/11/2014 1058   AST 27 06/16/2014 0718   AST 16 02/11/2014 1058   ALT 10 06/16/2014 0718   ALT 11 02/11/2014 1058   ALKPHOS 67 06/16/2014 0718   ALKPHOS 68 02/11/2014 1058   BILITOT 0.3 06/16/2014 0718   BILITOT 0.35 02/11/2014 1058   GFRNONAA 78* 06/17/2014 0349   GFRAA 90* 06/17/2014 0349    I No results found for: SPEP  Lab Results  Component Value Date   WBC 6.7 06/17/2014   NEUTROABS 7.7 06/16/2014   HGB 12.4 06/17/2014   HCT 38.7 06/17/2014   MCV 91.5 06/17/2014   PLT 217 06/17/2014      Chemistry      Component Value Date/Time   NA 139 06/17/2014 0349   NA 142 02/11/2014 1058   K 3.7 06/17/2014 0349   K 4.1 02/11/2014  1058   CL 109  06/17/2014 0349   CO2 25 06/17/2014 0349   CO2 27 02/11/2014 1058   BUN <5* 06/17/2014 0349   BUN 12.0 02/11/2014 1058   CREATININE 0.65 06/17/2014 0349   CREATININE 0.8 02/11/2014 1058      Component Value Date/Time   CALCIUM 7.4* 06/17/2014 0349   CALCIUM 9.2 02/11/2014 1058   ALKPHOS 67 06/16/2014 0718   ALKPHOS 68 02/11/2014 1058   AST 27 06/16/2014 0718   AST 16 02/11/2014 1058   ALT 10 06/16/2014 0718   ALT 11 02/11/2014 1058   BILITOT 0.3 06/16/2014 0718   BILITOT 0.35 02/11/2014 1058       No results found for: LABCA2  No components found for: LABCA125  No results for input(s): INR in the last 168 hours.  Urinalysis    Component Value Date/Time   COLORURINE YELLOW 02/19/2014 1045   APPEARANCEUR CLEAR 02/19/2014 1045   LABSPEC 1.007 02/19/2014 1045   PHURINE 7.5 02/19/2014 1045   GLUCOSEU NEGATIVE 02/19/2014 1045   HGBUR NEGATIVE 02/19/2014 [place] 02/19/2014 1045   KETONESUR NEGATIVE 02/19/2014 1045   PROTEINUR NEGATIVE 02/19/2014 1045   UROBILINOGEN 0.2 02/19/2014 1045   NITRITE NEGATIVE 02/19/2014 1045   LEUKOCYTESUR NEGATIVE 02/19/2014 1045    STUDIES: CLINICAL DATA: Shortness of breath and mid chest pain. Cough. Symptoms beginning on Sunday. History of pneumonia and breast cancer.  EXAM: CT ANGIOGRAPHY CHEST WITH CONTRAST  TECHNIQUE: Multidetector CT imaging of the chest was performed using the standard protocol during bolus administration of intravenous contrast. Multiplanar CT image reconstructions and MIPs were obtained to evaluate the vascular anatomy.  CONTRAST: 29mL OMNIPAQUE IOHEXOL 350 MG/ML SOLN  COMPARISON: 08/21/2012  FINDINGS: Technically adequate study with good opacification of the central and segmental pulmonary arteries. No focal filling defects demonstrated. No evidence of significant pulmonary embolus. Filling defect in the superior vena cava likely represent cm flow of on opacified  blood.  Normal heart size. Normal caliber thoracic aorta. No aortic dissection. Great vessel origins are patent. Calcification in the aorta. Esophagus is decompressed. No significant lymphadenopathy in the chest. Surgical clips in the left axilla. Postoperative bilateral mastectomies with left breast reconstruction prosthesis.  Calcification and pleural thickening in the apices suggesting postinflammatory change. Diffuse emphysematous changes in the lungs. No focal airspace disease or consolidation. Airways are patent. No pleural effusions. No pneumothorax.  Included portions of the upper abdominal organs are grossly unremarkable except for vascular calcifications. Degenerative changes in the thoracic spine. No destructive bone lesions appreciated.  Review of the MIP images confirms the above findings.  IMPRESSION: No evidence of significant pulmonary embolus. With emphysematous changes in the lungs with apical pleural thickening and calcifications suggesting postinflammatory change. No evidence of active pulmonary disease.   Electronically Signed  By: Lucienne Capers M.D.  On: 06/16/2014 05:21   ASSESSMENT: 81 y.o. [place] woman  (1) status post left modified radical mastectomy in 1989; receive tamoxifen adjuvantly for 10 years.  (2) status post right mastectomy and sentinel lymph node sampling 04/03/2013 for a pT2 pN0, stage IIA invasive ductal carcinoma, grade 2, estrogen receptor 100% positive, progesterone receptor negative, with an MIB-1 of 20%, and no HER-2 amplification.  (3) anastrozole started 05/22/2013  (4) denosumab to start 08/17/2013, to be repeated Q 6 months while on anastrozole  (5) BRCA 2 positivity, s/p bilateral salpingo oophorectomy by Dr. Skeet Latch on 02/23/14 with benign pathology  PLAN: Rhonda Bryant is doing well as far as her breast cancer  is concerned. She is 1.5 years out from her definitive surgery with no evidence of recurrent disease. She  is tolerating the anastrozole well and will continue this drug for 5 years of antiestrogen therapy. She is overdue for her denosumab injection so I have added her onto the schedule to receive one today. She has not yet had her labs drawn yet, so this will occur first.   Rhonda Bryant plans to find a new oncologist in the [place], [place] area and will have her records from here faxed when this occurs. She understands and agrees with this plan. She knows the goal of treatment in her case is cure. She has been encouraged to call with any issues that might arise before her next visit here. It has been a pleasure to serve this patient.    Laurie Panda, NP  10/22/2014 12:25 PM

## 2014-10-22 NOTE — Telephone Encounter (Signed)
Daughter called looking for her mom.  Patient has finished injection per [facility].  This nurse checked valet, all restrooms and no sign of patient.  Registration saw her walking in lobby after injection.  Santiago Glad is here to help move her parents.  Our numbers on record have been discontiinued.  Karen's return number is [phone].

## 2014-12-21 ENCOUNTER — Encounter: Payer: Self-pay | Admitting: Internal Medicine

## 2015-04-26 ENCOUNTER — Encounter: Payer: Self-pay | Admitting: Internal Medicine

## 2015-05-19 ENCOUNTER — Encounter: Payer: Self-pay | Admitting: Internal Medicine

## 2015-05-19 IMAGING — CR DG CHEST 2V
2 series · 2 of 2 positions shown · non-contrast
Comparison: 04/23/2014

CLINICAL DATA: Shortness of breath tonight. History of COPD,
bilateral mastectomy, left-side reconstructive surgery.

EXAM:
CHEST  2 VIEW

[chest pa]
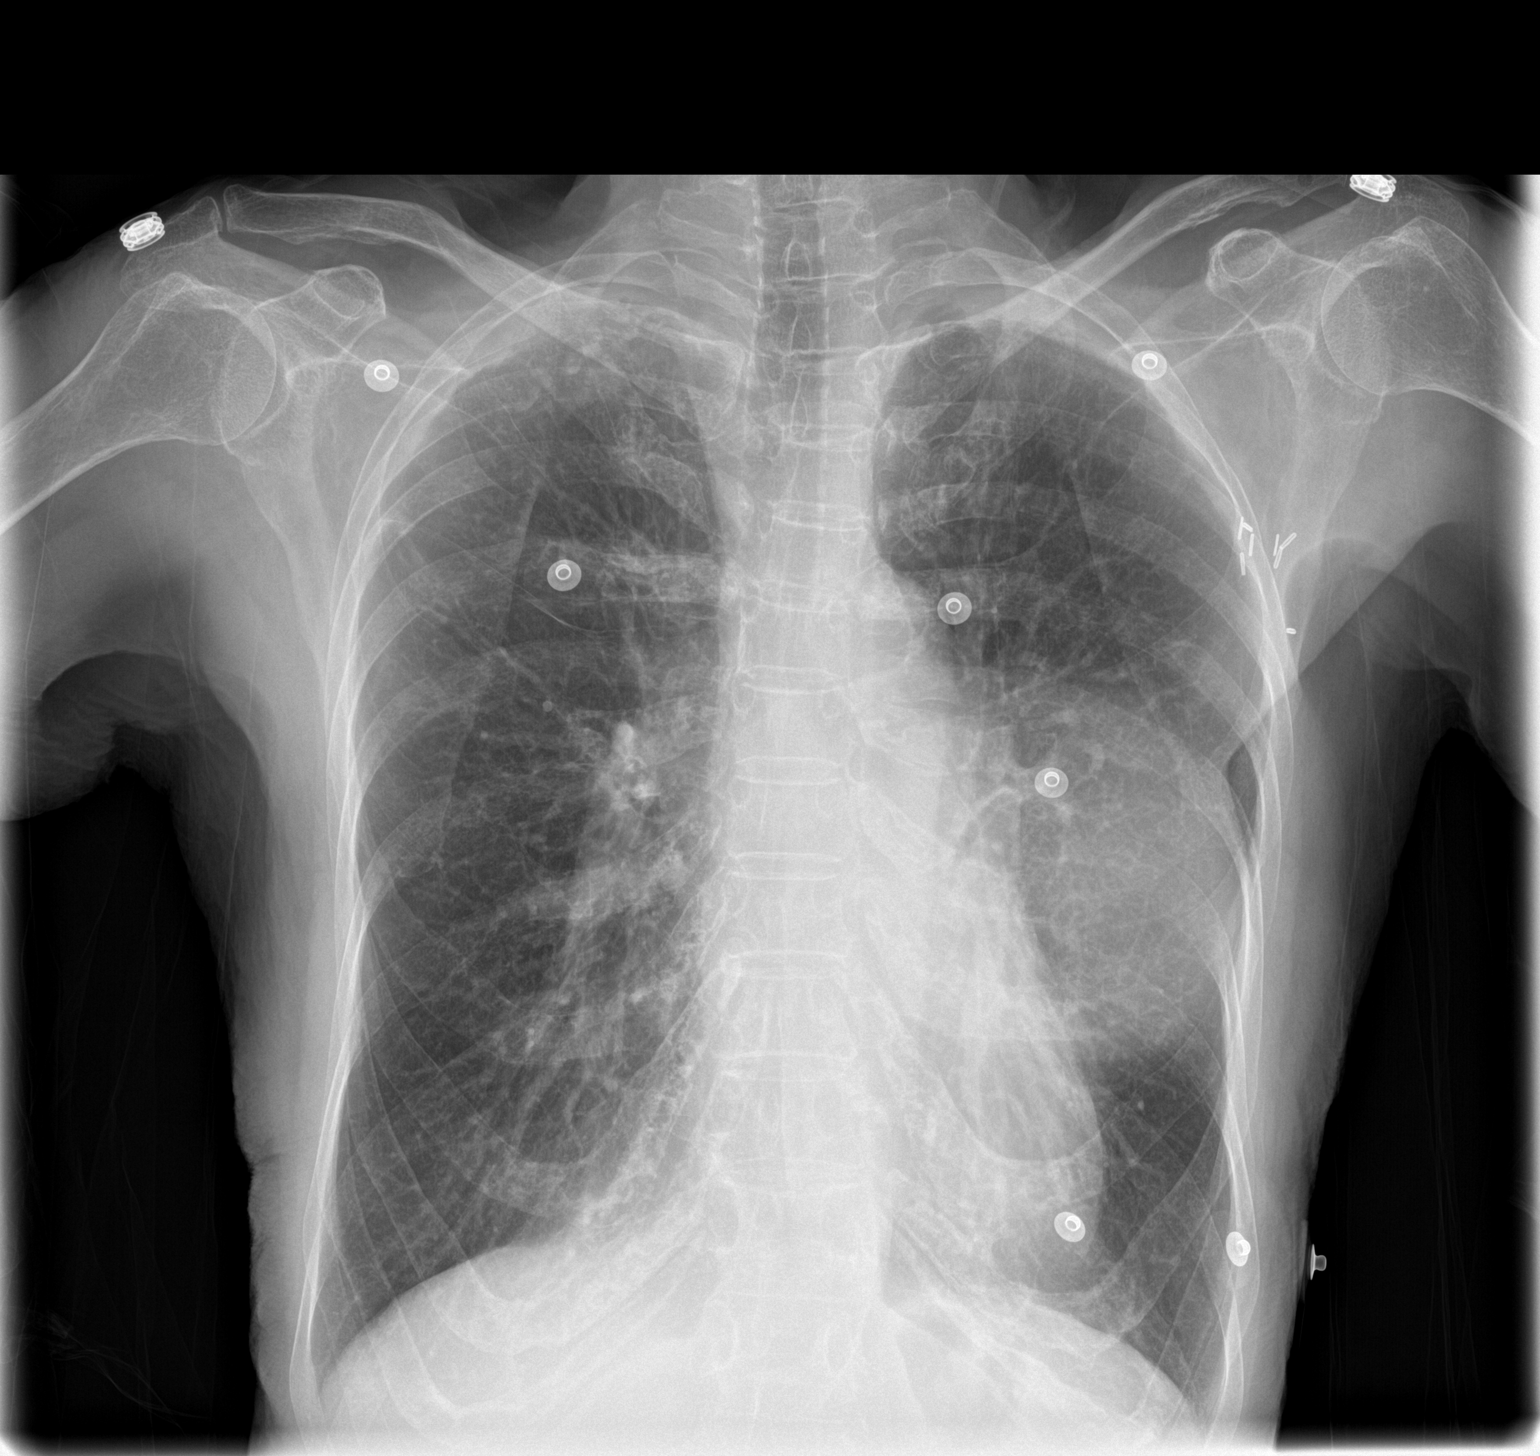

[chest lat]
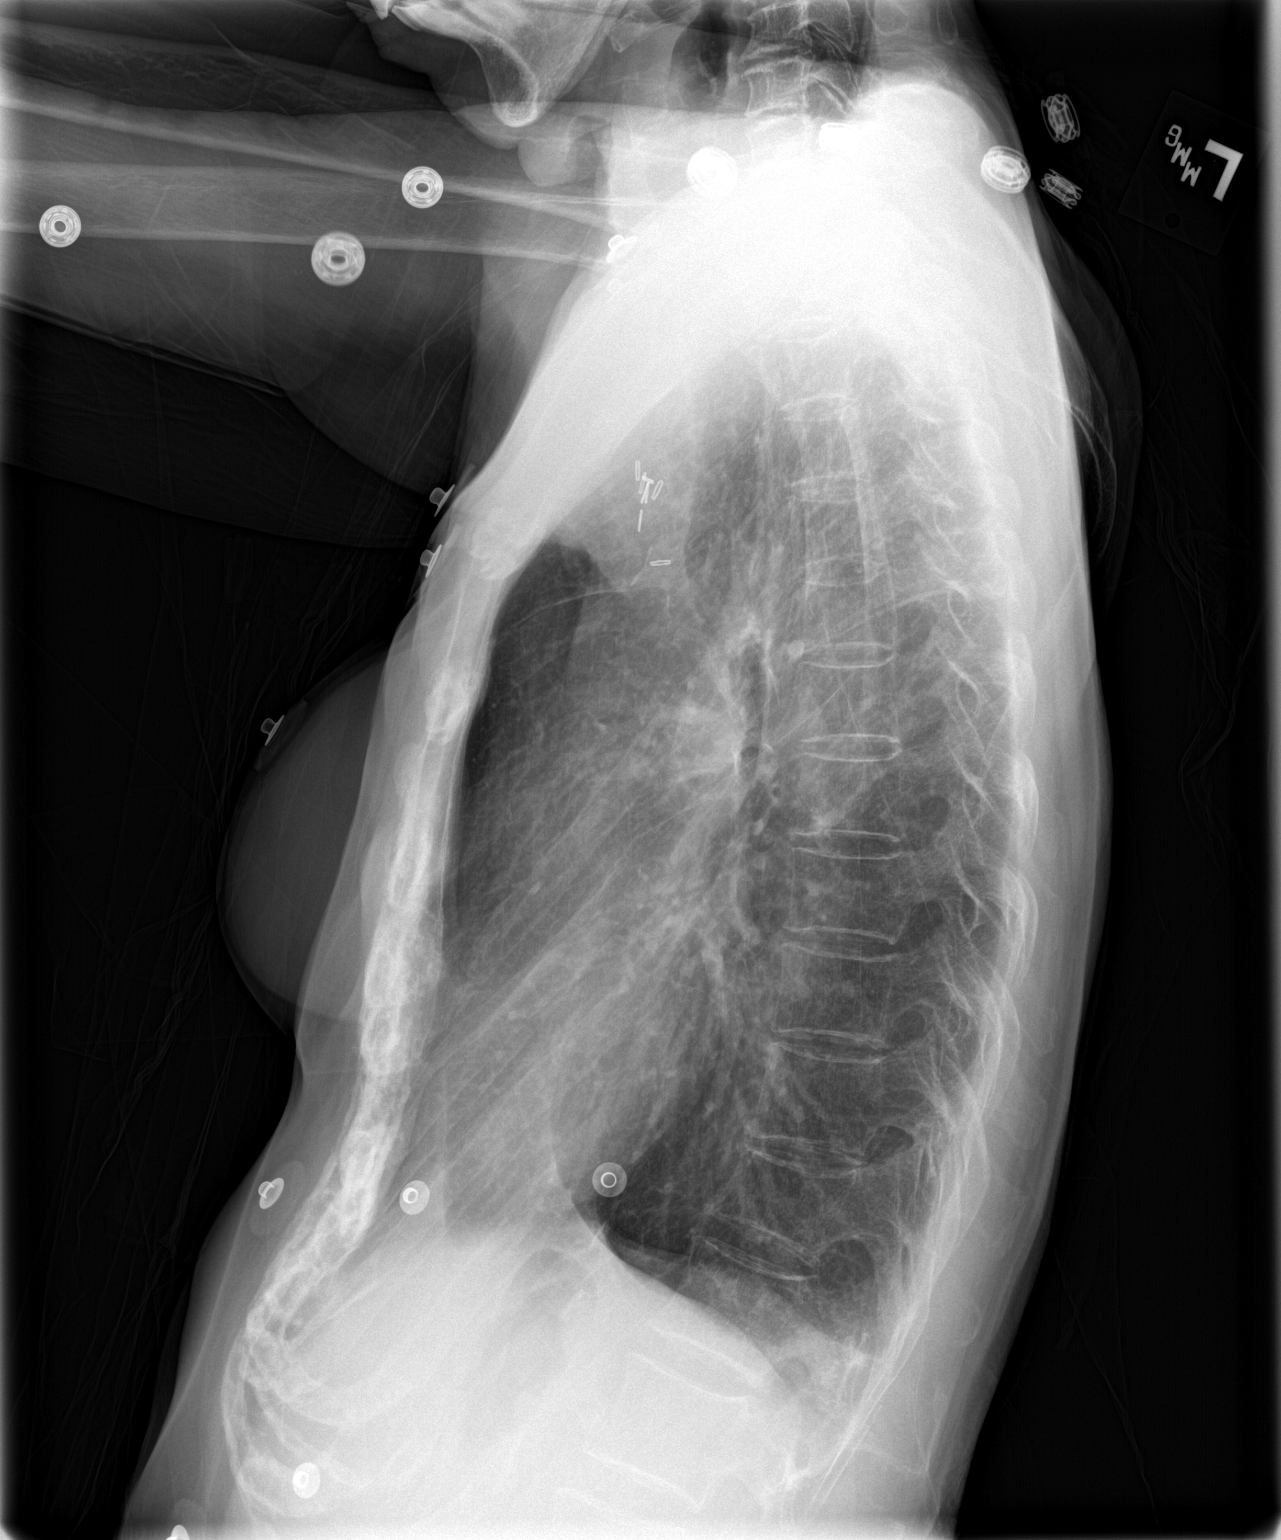

[2 of 2 positions shown; findings below may reference images not displayed]

FINDINGS: Normal heart size and pulmonary vascularity. Emphysematous changes
in the lungs with scattered fibrosis. No focal airspace disease or
consolidation. Bilateral apical pleural thickening. Surgical clips
in the left axilla. Left breast prosthesis. No blunting of
costophrenic angles. No pneumothorax. Mediastinal contours appear
intact.
IMPRESSION: Emphysematous changes in the lungs. No evidence of active pulmonary
disease.

## 2015-09-06 ENCOUNTER — Ambulatory Visit: Payer: Medicare Other | Admitting: Internal Medicine

## 2016-10-24 ENCOUNTER — Encounter: Payer: Self-pay | Admitting: Gynecology

## 2017-04-16 ENCOUNTER — Other Ambulatory Visit: Payer: Self-pay | Admitting: Nurse Practitioner

## 2019-01-13 ENCOUNTER — Encounter: Payer: Self-pay | Admitting: Genetic Counselor

## 2021-03-29 ENCOUNTER — Other Ambulatory Visit: Payer: Self-pay | Admitting: Nurse Practitioner

## 2021-10-09 DEATH — deceased
